# Patient Record
Sex: Female | Born: 1985 | Race: Black or African American | Hispanic: No | Marital: Single | Smoking: Never smoker
Health system: Southern US, Community
[De-identification: ages and names within clinical notes are randomized; demographics above are authoritative.]

## PROBLEM LIST (undated history)

## (undated) ENCOUNTER — Ambulatory Visit: Admission: EM | Source: Home / Self Care

## (undated) DIAGNOSIS — IMO0002 Reserved for concepts with insufficient information to code with codable children: Secondary | ICD-10-CM

## (undated) DIAGNOSIS — O1490 Unspecified pre-eclampsia, unspecified trimester: Secondary | ICD-10-CM

## (undated) HISTORY — DX: Unspecified pre-eclampsia, unspecified trimester: O14.90

## (undated) HISTORY — DX: Reserved for concepts with insufficient information to code with codable children: IMO0002

---
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
## 2003-01-10 ENCOUNTER — Inpatient Hospital Stay (HOSPITAL_COMMUNITY): Admission: AD | Admit: 2003-01-10 | Discharge: 2003-01-11 | Payer: Self-pay | Admitting: Obstetrics

## 2003-02-28 ENCOUNTER — Inpatient Hospital Stay (HOSPITAL_COMMUNITY): Admission: AD | Admit: 2003-02-28 | Discharge: 2003-03-01 | Payer: Self-pay | Admitting: Obstetrics

## 2003-04-16 ENCOUNTER — Inpatient Hospital Stay (HOSPITAL_COMMUNITY): Admission: AD | Admit: 2003-04-16 | Discharge: 2003-04-16 | Payer: Self-pay | Admitting: Obstetrics

## 2003-04-19 ENCOUNTER — Inpatient Hospital Stay (HOSPITAL_COMMUNITY): Admission: AD | Admit: 2003-04-19 | Discharge: 2003-04-19 | Payer: Self-pay | Admitting: Obstetrics

## 2003-04-28 ENCOUNTER — Emergency Department (HOSPITAL_COMMUNITY): Admission: EM | Admit: 2003-04-28 | Discharge: 2003-04-28 | Payer: Self-pay | Admitting: Emergency Medicine

## 2003-05-03 ENCOUNTER — Inpatient Hospital Stay (HOSPITAL_COMMUNITY): Admission: AD | Admit: 2003-05-03 | Discharge: 2003-05-10 | Payer: Self-pay | Admitting: Obstetrics

## 2003-05-06 ENCOUNTER — Encounter (INDEPENDENT_AMBULATORY_CARE_PROVIDER_SITE_OTHER): Payer: Self-pay | Admitting: *Deleted

## 2003-07-30 ENCOUNTER — Emergency Department (HOSPITAL_COMMUNITY): Admission: EM | Admit: 2003-07-30 | Discharge: 2003-07-30 | Payer: Self-pay | Admitting: *Deleted

## 2004-06-09 ENCOUNTER — Emergency Department (HOSPITAL_COMMUNITY): Admission: EM | Admit: 2004-06-09 | Discharge: 2004-06-09 | Payer: Self-pay | Admitting: Emergency Medicine

## 2004-07-11 ENCOUNTER — Emergency Department (HOSPITAL_COMMUNITY): Admission: EM | Admit: 2004-07-11 | Discharge: 2004-07-11 | Payer: Self-pay | Admitting: Emergency Medicine

## 2004-08-18 ENCOUNTER — Inpatient Hospital Stay (HOSPITAL_COMMUNITY): Admission: AD | Admit: 2004-08-18 | Discharge: 2004-08-18 | Payer: Self-pay | Admitting: Obstetrics

## 2005-10-28 ENCOUNTER — Inpatient Hospital Stay (HOSPITAL_COMMUNITY): Admission: AD | Admit: 2005-10-28 | Discharge: 2005-10-28 | Payer: Self-pay | Admitting: Obstetrics

## 2005-11-20 ENCOUNTER — Emergency Department (HOSPITAL_COMMUNITY): Admission: EM | Admit: 2005-11-20 | Discharge: 2005-11-20 | Payer: Self-pay | Admitting: Emergency Medicine

## 2006-04-14 ENCOUNTER — Emergency Department (HOSPITAL_COMMUNITY): Admission: EM | Admit: 2006-04-14 | Discharge: 2006-04-14 | Payer: Self-pay | Admitting: Emergency Medicine

## 2006-05-30 ENCOUNTER — Ambulatory Visit (HOSPITAL_BASED_OUTPATIENT_CLINIC_OR_DEPARTMENT_OTHER): Admission: RE | Admit: 2006-05-30 | Discharge: 2006-05-30 | Payer: Self-pay | Admitting: Cardiology

## 2006-06-02 ENCOUNTER — Ambulatory Visit: Payer: Self-pay | Admitting: Internal Medicine

## 2006-06-20 ENCOUNTER — Inpatient Hospital Stay (HOSPITAL_COMMUNITY): Admission: AD | Admit: 2006-06-20 | Discharge: 2006-06-21 | Payer: Self-pay | Admitting: Obstetrics & Gynecology

## 2006-06-23 ENCOUNTER — Inpatient Hospital Stay (HOSPITAL_COMMUNITY): Admission: AD | Admit: 2006-06-23 | Discharge: 2006-06-23 | Payer: Self-pay | Admitting: Gynecology

## 2006-07-08 ENCOUNTER — Inpatient Hospital Stay (HOSPITAL_COMMUNITY): Admission: AD | Admit: 2006-07-08 | Discharge: 2006-07-08 | Payer: Self-pay | Admitting: Obstetrics & Gynecology

## 2006-07-24 ENCOUNTER — Inpatient Hospital Stay (HOSPITAL_COMMUNITY): Admission: AD | Admit: 2006-07-24 | Discharge: 2006-07-24 | Payer: Self-pay | Admitting: Obstetrics & Gynecology

## 2006-07-30 ENCOUNTER — Inpatient Hospital Stay (HOSPITAL_COMMUNITY): Admission: AD | Admit: 2006-07-30 | Discharge: 2006-07-31 | Payer: Self-pay | Admitting: Obstetrics

## 2007-01-31 ENCOUNTER — Encounter: Admission: RE | Admit: 2007-01-31 | Discharge: 2007-05-01 | Payer: Self-pay | Admitting: Obstetrics & Gynecology

## 2007-02-08 ENCOUNTER — Emergency Department (HOSPITAL_COMMUNITY): Admission: EM | Admit: 2007-02-08 | Discharge: 2007-02-08 | Payer: Self-pay | Admitting: Emergency Medicine

## 2007-06-04 ENCOUNTER — Inpatient Hospital Stay (HOSPITAL_COMMUNITY): Admission: AD | Admit: 2007-06-04 | Discharge: 2007-06-04 | Payer: Self-pay | Admitting: Obstetrics & Gynecology

## 2007-08-13 ENCOUNTER — Encounter: Admission: RE | Admit: 2007-08-13 | Discharge: 2007-08-13 | Payer: Self-pay | Admitting: Family Medicine

## 2007-09-25 ENCOUNTER — Emergency Department (HOSPITAL_COMMUNITY): Admission: EM | Admit: 2007-09-25 | Discharge: 2007-09-25 | Payer: Self-pay | Admitting: Family Medicine

## 2008-01-06 ENCOUNTER — Emergency Department (HOSPITAL_COMMUNITY): Admission: EM | Admit: 2008-01-06 | Discharge: 2008-01-06 | Payer: Self-pay | Admitting: Emergency Medicine

## 2008-04-09 ENCOUNTER — Ambulatory Visit: Payer: Self-pay | Admitting: Nurse Practitioner

## 2008-04-09 DIAGNOSIS — K648 Other hemorrhoids: Secondary | ICD-10-CM | POA: Insufficient documentation

## 2008-04-09 DIAGNOSIS — Z8711 Personal history of peptic ulcer disease: Secondary | ICD-10-CM | POA: Insufficient documentation

## 2008-04-15 ENCOUNTER — Ambulatory Visit: Payer: Self-pay | Admitting: *Deleted

## 2008-04-28 ENCOUNTER — Ambulatory Visit: Payer: Self-pay | Admitting: Nurse Practitioner

## 2008-04-28 ENCOUNTER — Encounter (INDEPENDENT_AMBULATORY_CARE_PROVIDER_SITE_OTHER): Payer: Self-pay | Admitting: Nurse Practitioner

## 2008-04-28 LAB — CONVERTED CEMR LAB
Blood in Urine, dipstick: NEGATIVE
Glucose, Urine, Semiquant: NEGATIVE
Ketones, urine, test strip: NEGATIVE
Nitrite: NEGATIVE
Protein, U semiquant: NEGATIVE

## 2008-04-29 ENCOUNTER — Encounter (INDEPENDENT_AMBULATORY_CARE_PROVIDER_SITE_OTHER): Payer: Self-pay | Admitting: Nurse Practitioner

## 2008-04-29 LAB — CONVERTED CEMR LAB
ALT: 11 units/L (ref 0–35)
Albumin: 3.8 g/dL (ref 3.5–5.2)
BUN: 12 mg/dL (ref 6–23)
Basophils Absolute: 0 10*3/uL (ref 0.0–0.1)
CO2: 24 meq/L (ref 19–32)
Calcium: 9 mg/dL (ref 8.4–10.5)
Chlamydia, DNA Probe: NEGATIVE
Chloride: 103 meq/L (ref 96–112)
Cholesterol: 168 mg/dL (ref 0–200)
Creatinine, Ser: 0.74 mg/dL (ref 0.40–1.20)
GC Probe Amp, Genital: NEGATIVE
Hemoglobin: 10.7 g/dL — ABNORMAL LOW (ref 12.0–15.0)
Lymphocytes Relative: 21 % (ref 12–46)
Monocytes Absolute: 0.6 10*3/uL (ref 0.1–1.0)
Monocytes Relative: 5 % (ref 3–12)
Neutro Abs: 9.3 10*3/uL — ABNORMAL HIGH (ref 1.7–7.7)
Potassium: 4.2 meq/L (ref 3.5–5.3)
RBC: 5.39 M/uL — ABNORMAL HIGH (ref 3.87–5.11)
Total CHOL/HDL Ratio: 4

## 2008-05-06 DIAGNOSIS — B977 Papillomavirus as the cause of diseases classified elsewhere: Secondary | ICD-10-CM | POA: Insufficient documentation

## 2008-05-06 DIAGNOSIS — R87619 Unspecified abnormal cytological findings in specimens from cervix uteri: Secondary | ICD-10-CM | POA: Insufficient documentation

## 2008-05-10 ENCOUNTER — Telehealth (INDEPENDENT_AMBULATORY_CARE_PROVIDER_SITE_OTHER): Payer: Self-pay | Admitting: Nurse Practitioner

## 2008-05-14 ENCOUNTER — Ambulatory Visit: Payer: Self-pay | Admitting: Nurse Practitioner

## 2008-05-14 DIAGNOSIS — N898 Other specified noninflammatory disorders of vagina: Secondary | ICD-10-CM | POA: Insufficient documentation

## 2008-05-14 LAB — CONVERTED CEMR LAB
Chlamydia, Swab/Urine, PCR: NEGATIVE
Glucose, Urine, Semiquant: NEGATIVE
KOH Prep: NEGATIVE
Ketones, urine, test strip: NEGATIVE
Specific Gravity, Urine: >=1.03
Urobilinogen, UA: 0.2
pH: 5.5

## 2008-05-17 ENCOUNTER — Encounter (INDEPENDENT_AMBULATORY_CARE_PROVIDER_SITE_OTHER): Payer: Self-pay | Admitting: Nurse Practitioner

## 2008-05-18 ENCOUNTER — Encounter (INDEPENDENT_AMBULATORY_CARE_PROVIDER_SITE_OTHER): Payer: Self-pay | Admitting: Nurse Practitioner

## 2008-06-02 ENCOUNTER — Ambulatory Visit: Payer: Self-pay | Admitting: Obstetrics and Gynecology

## 2008-06-02 ENCOUNTER — Other Ambulatory Visit: Admission: RE | Admit: 2008-06-02 | Discharge: 2008-06-02 | Payer: Self-pay | Admitting: Obstetrics and Gynecology

## 2008-06-16 ENCOUNTER — Ambulatory Visit: Payer: Self-pay | Admitting: Obstetrics & Gynecology

## 2008-06-22 ENCOUNTER — Encounter (INDEPENDENT_AMBULATORY_CARE_PROVIDER_SITE_OTHER): Payer: Self-pay | Admitting: Nurse Practitioner

## 2008-07-14 ENCOUNTER — Telehealth (INDEPENDENT_AMBULATORY_CARE_PROVIDER_SITE_OTHER): Payer: Self-pay | Admitting: Nurse Practitioner

## 2008-07-14 DIAGNOSIS — N926 Irregular menstruation, unspecified: Secondary | ICD-10-CM | POA: Insufficient documentation

## 2008-07-14 DIAGNOSIS — N939 Abnormal uterine and vaginal bleeding, unspecified: Secondary | ICD-10-CM

## 2008-09-01 ENCOUNTER — Emergency Department (HOSPITAL_COMMUNITY): Admission: EM | Admit: 2008-09-01 | Discharge: 2008-09-01 | Payer: Self-pay | Admitting: Emergency Medicine

## 2008-09-06 ENCOUNTER — Ambulatory Visit: Payer: Self-pay | Admitting: Family Medicine

## 2008-09-06 DIAGNOSIS — G44209 Tension-type headache, unspecified, not intractable: Secondary | ICD-10-CM | POA: Insufficient documentation

## 2008-10-11 ENCOUNTER — Telehealth (INDEPENDENT_AMBULATORY_CARE_PROVIDER_SITE_OTHER): Payer: Self-pay | Admitting: Nurse Practitioner

## 2008-12-07 ENCOUNTER — Inpatient Hospital Stay (HOSPITAL_COMMUNITY): Admission: AD | Admit: 2008-12-07 | Discharge: 2008-12-07 | Payer: Self-pay | Admitting: Obstetrics and Gynecology

## 2009-06-30 ENCOUNTER — Ambulatory Visit: Payer: Self-pay | Admitting: Nurse Practitioner

## 2009-06-30 DIAGNOSIS — B354 Tinea corporis: Secondary | ICD-10-CM | POA: Insufficient documentation

## 2009-06-30 LAB — CONVERTED CEMR LAB
Nitrite: NEGATIVE
Protein, U semiquant: NEGATIVE
Urobilinogen, UA: 0.2
WBC Urine, dipstick: NEGATIVE

## 2009-07-04 ENCOUNTER — Encounter (INDEPENDENT_AMBULATORY_CARE_PROVIDER_SITE_OTHER): Payer: Self-pay | Admitting: Nurse Practitioner

## 2009-07-04 DIAGNOSIS — D649 Anemia, unspecified: Secondary | ICD-10-CM

## 2009-07-04 LAB — CONVERTED CEMR LAB
ALT: 11 units/L (ref 0–35)
AST: 15 units/L (ref 0–37)
BUN: 13 mg/dL (ref 6–23)
Basophils Relative: 0 % (ref 0–1)
CO2: 24 meq/L (ref 19–32)
Calcium: 8.8 mg/dL (ref 8.4–10.5)
Chloride: 104 meq/L (ref 96–112)
Cholesterol: 164 mg/dL (ref 0–200)
Creatinine, Ser: 0.61 mg/dL (ref 0.40–1.20)
Eosinophils Absolute: 0.2 10*3/uL (ref 0.0–0.7)
Eosinophils Relative: 2 % (ref 0–5)
Ferritin: 18 ng/mL (ref 10–291)
Folate: 16.3 ng/mL
HCT: 33.9 % — ABNORMAL LOW (ref 36.0–46.0)
HDL: 37 mg/dL — ABNORMAL LOW (ref >39)
MCHC: 28 g/dL — ABNORMAL LOW (ref 30.0–36.0)
MCV: 64.7 fL — ABNORMAL LOW (ref 78.0–100.0)
Neutrophils Relative %: 70 % (ref 43–77)
Platelets: 469 10*3/uL — ABNORMAL HIGH (ref 150–400)
Saturation Ratios: 6 % — ABNORMAL LOW (ref 20–55)
TIBC: 331 ug/dL (ref 250–470)
Total Bilirubin: 0.4 mg/dL (ref 0.3–1.2)
Total CHOL/HDL Ratio: 4.4
VLDL: 14 mg/dL (ref 0–40)

## 2009-11-30 ENCOUNTER — Ambulatory Visit: Payer: Self-pay | Admitting: Nurse Practitioner

## 2009-11-30 DIAGNOSIS — R252 Cramp and spasm: Secondary | ICD-10-CM | POA: Insufficient documentation

## 2009-11-30 LAB — CONVERTED CEMR LAB
Blood in Urine, dipstick: NEGATIVE
Cholesterol, target level: 200 mg/dL
KOH Prep: NEGATIVE
Nitrite: NEGATIVE
Protein, U semiquant: NEGATIVE
Urobilinogen, UA: 0.2
WBC Urine, dipstick: NEGATIVE
pH: 6

## 2009-12-02 ENCOUNTER — Encounter (INDEPENDENT_AMBULATORY_CARE_PROVIDER_SITE_OTHER): Payer: Self-pay | Admitting: Nurse Practitioner

## 2009-12-02 LAB — CONVERTED CEMR LAB
Eosinophils Absolute: 0.2 10*3/uL (ref 0.0–0.7)
HCT: 33.4 % — ABNORMAL LOW (ref 36.0–46.0)
Hemoglobin: 9.5 g/dL — ABNORMAL LOW (ref 12.0–15.0)
Lymphs Abs: 2.7 10*3/uL (ref 0.7–4.0)
MCHC: 28.4 g/dL — ABNORMAL LOW (ref 30.0–36.0)
MCV: 66.8 fL — ABNORMAL LOW (ref 78.0–100.0)
Monocytes Absolute: 0.7 10*3/uL (ref 0.1–1.0)
Monocytes Relative: 6 % (ref 3–12)
Neutrophils Relative %: 69 % (ref 43–77)
Pap Smear: NEGATIVE
RBC: 5 M/uL (ref 3.87–5.11)
WBC: 11.6 10*3/uL — ABNORMAL HIGH (ref 4.0–10.5)

## 2009-12-06 ENCOUNTER — Telehealth (INDEPENDENT_AMBULATORY_CARE_PROVIDER_SITE_OTHER): Payer: Self-pay | Admitting: Nurse Practitioner

## 2010-01-05 ENCOUNTER — Encounter (INDEPENDENT_AMBULATORY_CARE_PROVIDER_SITE_OTHER): Payer: Self-pay | Admitting: Nurse Practitioner

## 2010-01-05 ENCOUNTER — Ambulatory Visit: Payer: Self-pay | Admitting: Internal Medicine

## 2010-01-05 DIAGNOSIS — IMO0002 Reserved for concepts with insufficient information to code with codable children: Secondary | ICD-10-CM | POA: Insufficient documentation

## 2010-02-07 IMAGING — US US TRANSVAGINAL NON-OB
1 series · 14 of 25 positions shown · non-contrast
Comparison: none

CLINICAL DATA: Left-sided pelvic pain.  LMP:  05/19/06.
 TRANSABDOMINAL AND TRANSVAGINAL PELVIC ULTRASOUND:
TECHNIQUE: Both transabdominal and transvaginal ultrasound examinations of the pelvis were performed including evaluation of the uterus, ovaries, adnexal regions, and pelvic cul-de-sac.

[Series 1: us transvaginal non-ob · 0.28mm/px · 14 of 62 slices shown]
[im 1/62]
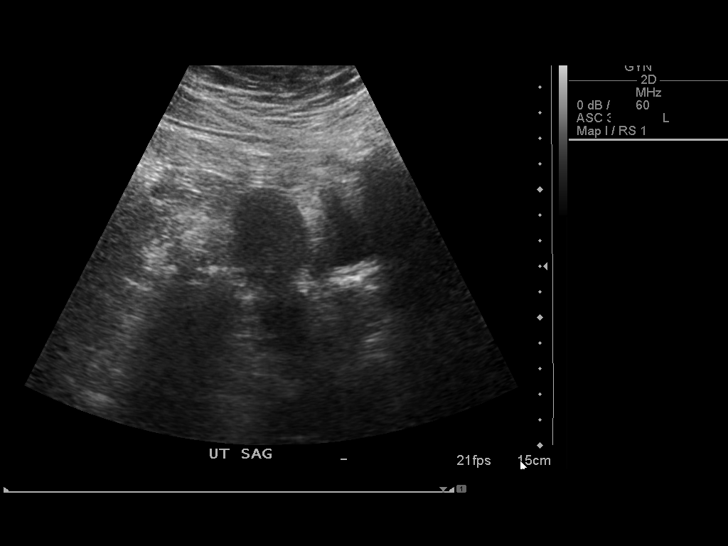
[im 6/62]
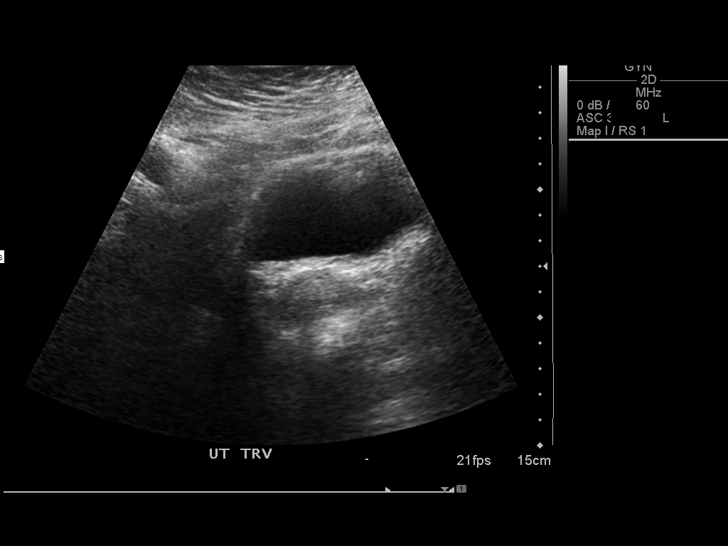
[im 11/62]
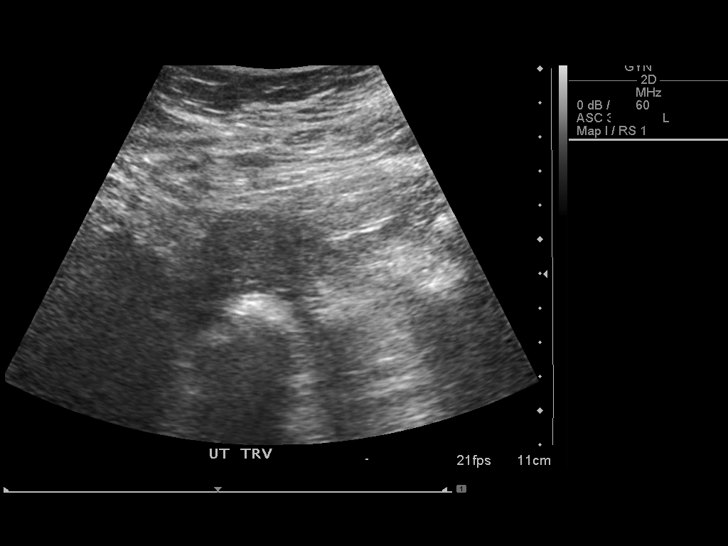
[im 16/62]
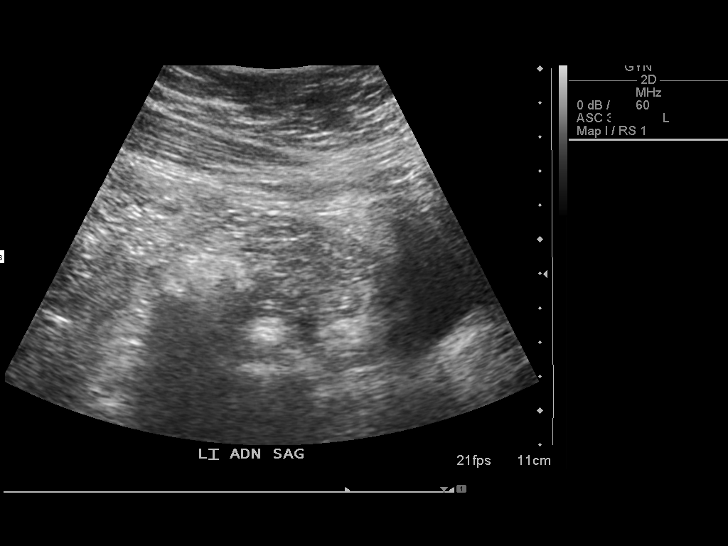
[im 21/62]
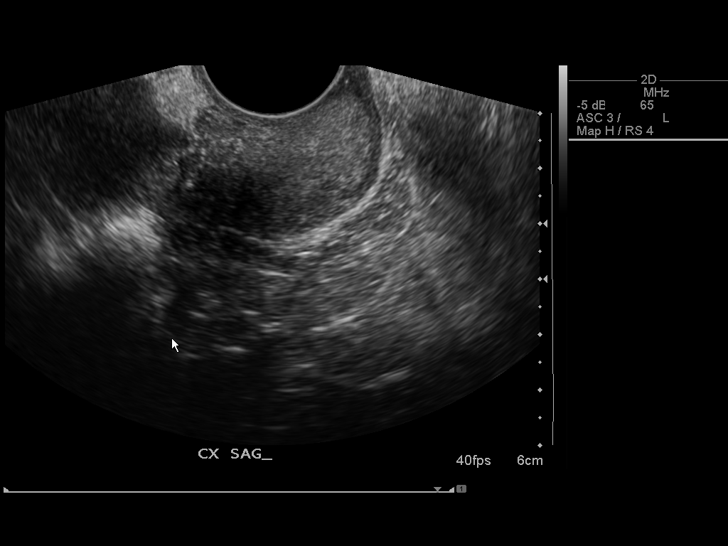
[im 23/62]
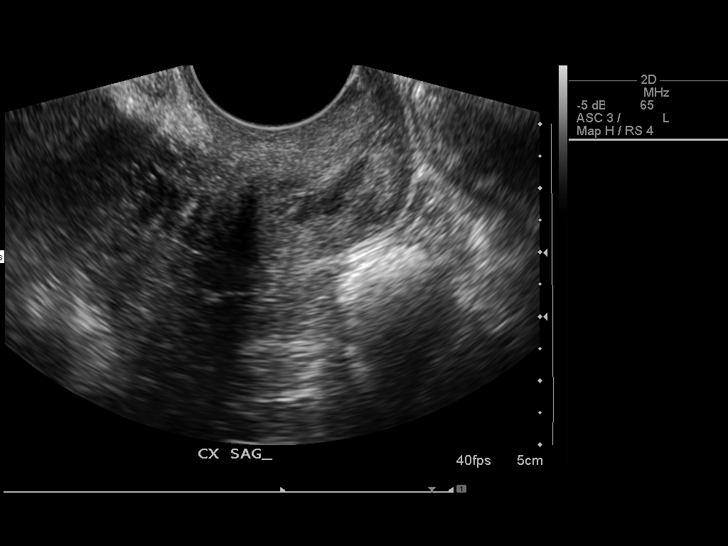
[im 28/62]
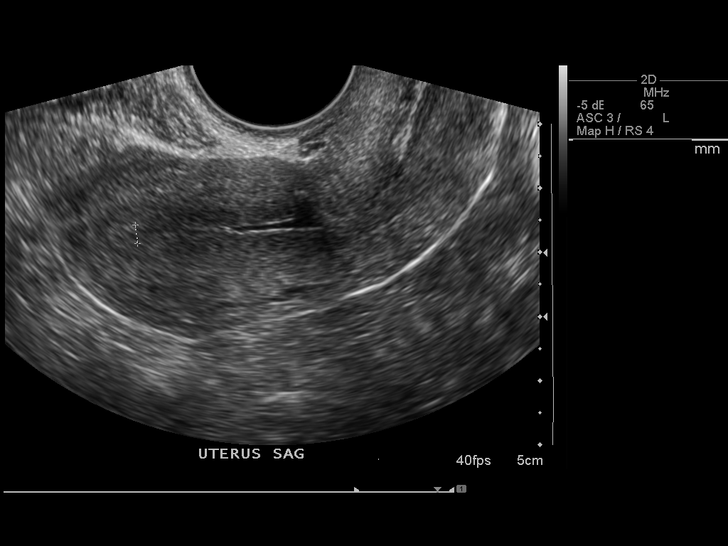
[im 34/62]
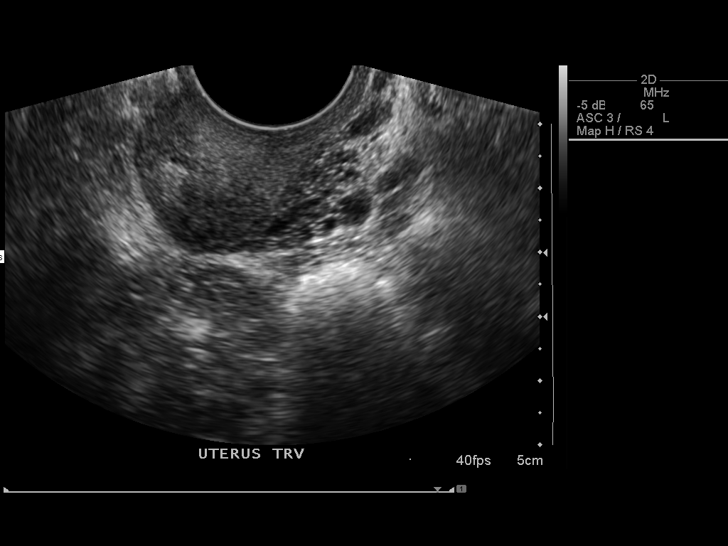
[im 39/62]
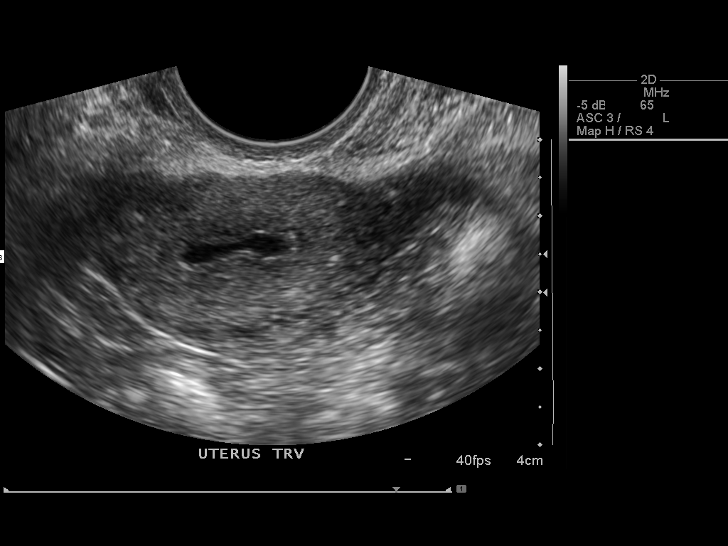
[im 41/62]
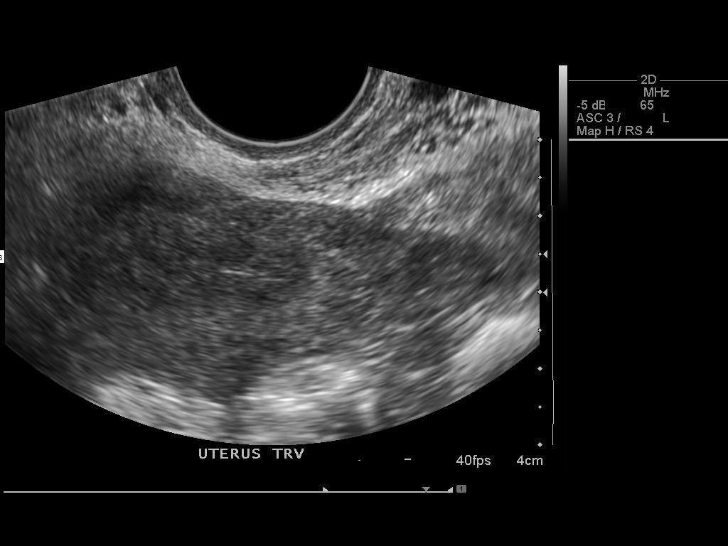
[im 46/62]
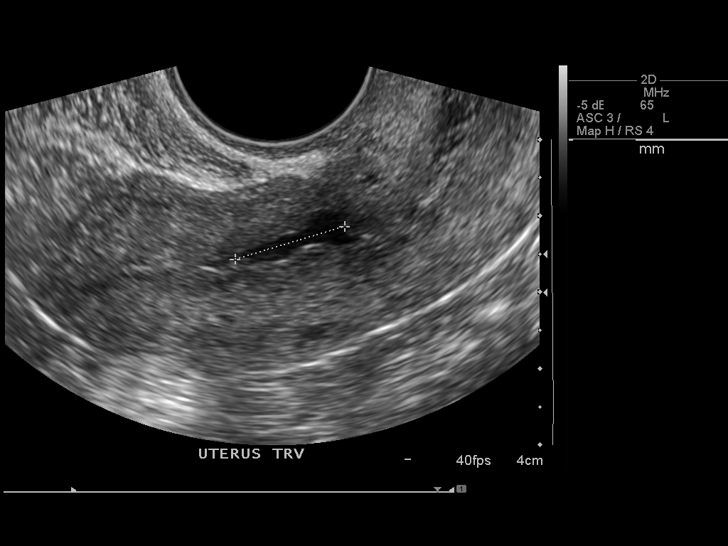
[im 51/62]
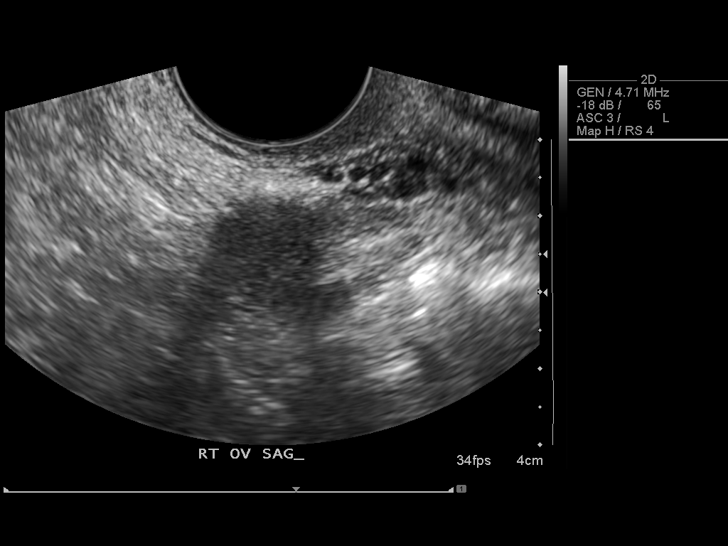
[im 56/62]
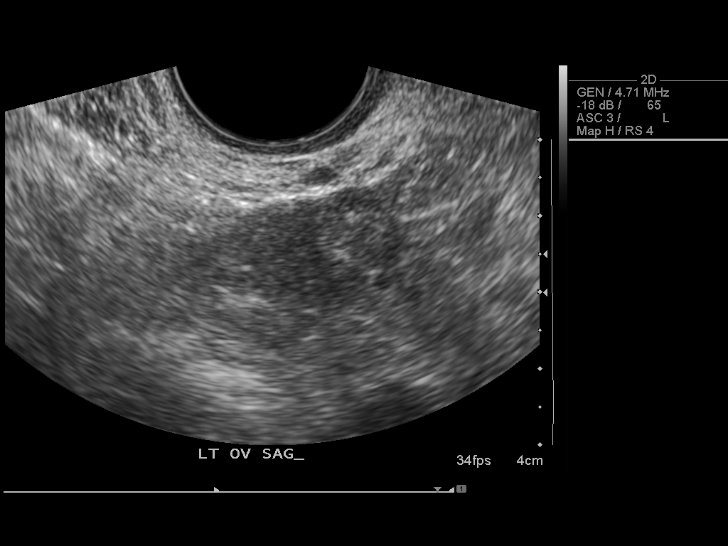
[im 62/62]
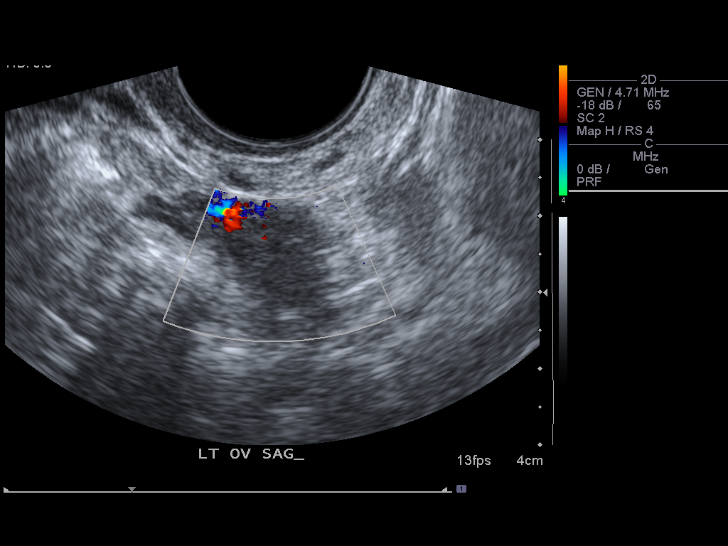

[14 of 25 positions shown; findings below may reference images not displayed]

FINDINGS: The uterus is normal in size measuring approximately 8 cm in length.  No fibroids or other uterine masses are identified.  There is a tiny amount of fluid noted in the endometrial cavity and endometrial thickness measures 1-2 mm.  
 Both ovaries are normal in appearance.  No adnexal masses are identified by transabdominal or transvaginal sonography.
IMPRESSION: 1.  Normal uterus.  No evidence of fibroids.
 2.  Normal ovaries.  No evidence of adnexal mass.

## 2010-02-24 ENCOUNTER — Emergency Department (HOSPITAL_COMMUNITY)
Admission: EM | Admit: 2010-02-24 | Discharge: 2010-02-24 | Payer: Self-pay | Source: Home / Self Care | Admitting: Family Medicine

## 2010-03-01 ENCOUNTER — Ambulatory Visit: Payer: Self-pay | Admitting: Nurse Practitioner

## 2010-03-01 DIAGNOSIS — M79609 Pain in unspecified limb: Secondary | ICD-10-CM

## 2010-03-31 ENCOUNTER — Telehealth (INDEPENDENT_AMBULATORY_CARE_PROVIDER_SITE_OTHER): Payer: Self-pay | Admitting: Nurse Practitioner

## 2010-04-02 DIAGNOSIS — IMO0002 Reserved for concepts with insufficient information to code with codable children: Secondary | ICD-10-CM

## 2010-04-02 DIAGNOSIS — R87619 Unspecified abnormal cytological findings in specimens from cervix uteri: Secondary | ICD-10-CM

## 2010-04-02 HISTORY — DX: Unspecified abnormal cytological findings in specimens from cervix uteri: R87.619

## 2010-04-02 HISTORY — PX: COLPOSCOPY: SHX161

## 2010-04-02 HISTORY — DX: Reserved for concepts with insufficient information to code with codable children: IMO0002

## 2010-04-03 ENCOUNTER — Emergency Department (HOSPITAL_COMMUNITY)
Admission: EM | Admit: 2010-04-03 | Discharge: 2010-04-03 | Payer: Self-pay | Source: Home / Self Care | Admitting: Family Medicine

## 2010-04-05 ENCOUNTER — Telehealth (INDEPENDENT_AMBULATORY_CARE_PROVIDER_SITE_OTHER): Payer: Self-pay | Admitting: Nurse Practitioner

## 2010-04-20 ENCOUNTER — Ambulatory Visit: Admit: 2010-04-20 | Payer: Self-pay | Admitting: Nurse Practitioner

## 2010-04-26 ENCOUNTER — Ambulatory Visit: Admit: 2010-04-26 | Payer: Self-pay | Admitting: Nurse Practitioner

## 2010-05-02 NOTE — Assessment & Plan Note (Signed)
Summary: Family Planning   Vital Signs:  Patient profile:   25 year old female Menstrual status:  regular LMP:     02/04/2010 Weight:      278.1 pounds BMI:     46.45 Temp:     97.8 degrees F oral Pulse rate:   76 / minute Pulse rhythm:   regular Resp:     20 per minute BP sitting:   140 / 80  (left arm) Cuff size:   large  Vitals Entered By: Levon Hedger (March 01, 2010 2:37 PM)  Nutrition Counseling: Patient's BMI is greater than 25 and therefore counseled on weight management options. CC: right big toe still giving her pain...wanats to get on birth control Is Patient Diabetic? No Pain Assessment Patient in pain? yes     Location: toe Intensity: 7 Type: throbbing  Does patient need assistance? Functional Status Self care Ambulation Normal LMP (date): 02/04/2010 LMP - Character: normal    Menses interval (days): 28 Menstrual flow (days): 5 Menstrual Status regular Enter LMP: 02/04/2010 Last PAP Result  Specimen Adequacy: Satisfactory for evaluation.   Interpretation/Result:Negative for intraepithelial Lesion or Malignancy.      Primary Care Provider:  Lehman Prom FNP  CC:  right big toe still giving her pain...wanats to get on birth control.  History of Present Illness:  Pt into the office still with come and go. Pt has been to the ER and x-rays of the right toe done no fracture joint not swollen Wears sneakers to work and that is the shoe she wears the majority of the time  Family Planning - pt was previously on implanon and it was removed 12/2008. Pt is now in a relationship and wants to restart meds. LMP 11.5.11 Pt was previously on birth control pills and she tolerated well but then she got pregnant in 2008 and had inplanon placed.  She does not want another implanon. Pt reports that she will be able to remember to take the meds.  Presents today with her maie signficant other  Obesithy - still working on weight. 1 pounds gain since last  visit but she admits she "cheated" during the holiday weekend  Allergies (verified): 1)  ! Codeine  Review of Systems General:  Denies fever. CV:  Denies chest pain or discomfort. Resp:  Denies cough. GI:  Denies abdominal pain, nausea, and vomiting. GU:  Denies discharge, hematuria, and urinary frequency. MS:  right great toe - NO inflammation tenderness with palpation of lateral paroncyia.  Physical Exam  General:  alert.   Head:  normocephalic.   Neurologic:  alert & oriented X3.     Foot/Ankle Exam  Foot Exam:    Right:    Inspection:  Normal    Palpation:  Normal    Stability:  stable    Tenderness:  no    Swelling:  no    Erythema:  no    no ingrown toenail   Impression & Recommendations:  Problem # 1:  TOE PAIN (ICD-729.5) advised pt that symptoms may be coming from the type of shoe that she wears need cushion in between to prevent toes from crowded  Problem # 2:  FAMILY PLANNING (ICD-V25.09) will start birth control pills advised pt to start the sunday after her next period condoms for the first month Orders: Urine Pregnancy Test  ([address]109)  Problem # 3:  MORBID OBESITY (ICD-278.01) continue to monitor wieght  Complete Medication List: 1)  Nystatin-triamcinolone 100000-0.1 Unit/gm-% Crea (Nystatin-triamcinolone) .... One application  topically to affected area until healed 2)  Ferrous Sulfate 325 (65 Fe) Mg Tabs (Ferrous sulfate) .... One tablet by mouth two times a day 3)  Sprintec 28 0.25-35 Mg-mcg Tabs (Norgestimate-eth estradiol) .... One tablet by mouth daily for birth control  Patient Instructions: 1)  Toe pain - x-rays normal as done in the hospital 2)  No gout as joint is not affected 3)  No ingrown toenails 4)  Monitor you shoes. Pain may be the way your toes are positioned in the shoes 5)  Family planning - start birth control pills.  Start on the sunday after your next period.  You may have some spotting once you start the pills since you  have not taken hormones in some time but this will get better. Be sure you use condoms for the first month until you are transitioned to the pills to be sure they are in your system and are effective Prescriptions: SPRINTEC 28 0.25-35 MG-MCG TABS (NORGESTIMATE-ETH ESTRADIOL) One tablet by mouth daily for birth control  #1 pack x 5   Entered and Authorized by:   Lehman Prom FNP   Signed by:   Lehman Prom FNP on 03/01/2010   Method used:   Print then Give to Patient   RxID:   [id number]    Orders Added: 1)  Est. Patient Level III [31517] 2)  Urine Pregnancy Test  [81025]    Prevention & Chronic Care Immunizations   Influenza vaccine: given at work  (01/05/2010)    Tetanus booster: 04/03/2003: historical per pt    Pneumococcal vaccine: Not documented  Other Screening   Pap smear:  Specimen Adequacy: Satisfactory for evaluation.   Interpretation/Result:Negative for intraepithelial Lesion or Malignancy.     (11/30/2009)   Pap smear action/deferral: PAP smear done  (04/28/2008)   Pap smear due: 12/2010   Smoking status: never  (11/30/2009)

## 2010-05-02 NOTE — Letter (Signed)
Summary: *HSN Results Follow up  [facility]  [address] [place], [place] [address]   Phone: [phone]  Fax: [phone]      12/02/2009   AZARIA STEGMAN [address] [place], [place]  [address]   Dear  Ms. Glenna Fellows,                            ____S.Drinkard,FNP   ____D. Gore,FNP       ____B. McPherson,MD   ____V. Rankins,MD    ____E. Mulberry,MD    _X___N. Daphine Deutscher, FNP  ____D. Reche Dixon, MD    ____K. Philipp Deputy, MD    ____Other     This letter is to inform you that your recent test(s):  ___X____Pap Smear    _______Lab Test     _______X-ray    ___X____ is within acceptable limits  _______ requires a medication change  _______ requires a follow-up lab visit  _______ requires a follow-up visit with your provider   Comments:  Pap Smear results are normal.       _________________________________________________________ If you have any questions, please contact our office [phone].                    Sincerely,    Lehman Prom FNP [facility]

## 2010-05-02 NOTE — Progress Notes (Signed)
Summary: Office Visit/DEPRESSION SCREENING  Office Visit/DEPRESSION SCREENING   Imported By: Arta Bruce 11/30/2009 14:25:33  _____________________________________________________________________  External Attachment:    Type:   Image     Comment:   External Document

## 2010-05-02 NOTE — Progress Notes (Signed)
Summary: LAB RESULTS  Phone Note Call from Patient Call back at [phone]   Caller: Patient Reason for Call: Lab or Test Results Summary of Call: PT WOULD LIKE TO GET LAB RESULTS Initial call taken by: Oscar La,  December 06, 2009 2:54 PM  Follow-up for Phone Call        Michaela Carrillo  December 06, 2009 5:09 PM Left message on machine for pt to return call to the office.  Additional Follow-up for Phone Call Additional follow up Details #1::        Michaela Carrillo RETURNED PHONE CALL FROM YESTERDAY. SHE SAYS TO LET [facility] KNOW THAT SHE'S NOT FEELING SICK BUT SHE IS HAVING PAIN IN HER STOMACH AND BACK. Michaela Carrillo ALSO SAYS THAT IF SHE DOESN'T ANSWER WHEN YOU CALL BACK, THAT IT IS OK TO LEAVE THE RESULTS ON HER PHONE  Additional Follow-up by: Michaela Carrillo,  December 07, 2009 10:01 AM    Additional Follow-up for Phone Call Additional follow up Details #2::    spoke with pt and informed her again of lab results which we had already talked about and to give her pap results. Follow-up by: Michaela Carrillo,  December 08, 2009 10:20 AM

## 2010-05-02 NOTE — Letter (Signed)
Summary: Handout Printed  Printed Handout:  - Shin Splints

## 2010-05-02 NOTE — Assessment & Plan Note (Signed)
Summary: Complete Physical Exam   Vital Signs:  Patient profile:   25 year old female Height:      65 inches Weight:      281.8 pounds BMI:     47.06 Temp:     98.2 degrees F oral Pulse rate:   60 / minute Pulse rhythm:   regular Resp:     16 per minute BP sitting:   108 / 70  (left arm) Cuff size:   large  Vitals Entered By: Levon Hedger (November 30, 2009 11:10 AM)  Nutrition Counseling: Patient's BMI is greater than 25 and therefore counseled on weight management options. CC: CPP, Lipid Management Is Patient Diabetic? No Pain Assessment Patient in pain? no       Does patient need assistance? Functional Status Self care Ambulation Normal   Primary Care Provider:  Lehman Prom FNP  CC:  CPP and Lipid Management.  History of Present Illness:  Pt into the office for complete physical exam  PAP - Last PAP done 04/2008 in this office No history of abnormal PAP Menses - monthly - ? how often menses starts which is between 22-24 days 1 daughter Not currently in a relationship  Mammogram - never had a mammogram no problems with breast no self breast exams at home  Obesity - pt has lost 16 pounds since her last visit.  Pt has been trying to change her diet.  She has not incorported exercise in her routine.    tdap - up to date  Optho - No current glasses. Last exam was over 2 years ago.  Dental - no recent dental exam  Social - pt is employed at [employer] and works 7-3pm She recently purchased a house  Lipid Management History:      Positive NCEP/ATP III risk factors include HDL cholesterol less than 40.  Negative NCEP/ATP III risk factors include female age less than 14 years old, no history of early menopause without estrogen hormone replacement, non-diabetic, no family history for ischemic heart disease, non-tobacco-user status, non-hypertensive, no ASHD (atherosclerotic heart disease), no prior stroke/TIA, no peripheral vascular disease, and no history of  aortic aneurysm.    Habits & Providers  Alcohol-Tobacco-Diet     Alcohol drinks/day: 0     Tobacco Status: never  Exercise-Depression-Behavior     Does Patient Exercise: no     Drug Use: never  Comments: PHQ-9 score = 0  Allergies (verified): 1)  ! Codeine  Social History: Does Patient Exercise:  no Drug Use:  never  Diabetic Foot Exam Foot Inspection Is there a history of a foot ulcer?              No Is there a foot ulcer now?              No Can the patient see the bottom of their feet?          No Are the shoes appropriate in style and fit?          Yes Is there swelling or an abnormal foot shape?          No Are the toenails long?                Yes Are the toenails thick?                Yes Are the toenails ingrown?              No Is there heavy callous build-up?  No Is there pain in the calf muscle (Intermittent claudication) when walking?    NoIs there a claw toe deformity?              No Is there elevated skin temperature?            No Is there limited ankle dorsiflexion?            No Is there foot or ankle muscle weakness?            No  Diabetic Foot Care Education Pulse Check          Right Foot          Left Foot Dorsalis Pedis:        normal            normal   Review of Systems General:  Denies fever. Eyes:  Denies blurring and discharge. ENT:  Denies earache. CV:  Denies chest pain or discomfort. Resp:  Denies cough. GU:  Denies discharge. MS:  Denies joint pain. Neuro:  Complains of tingling; right great toe - for the past 2 weeks she has awoke and noticed tingling in the great toe.  as she gets up and moves around the tingling gets better. Psych:  Denies depression.  Physical Exam  General:  alert.  obese Head:  normocephalic.   Eyes:  pupils equal, pupils round, and pupils reactive to light.   Ears:  bil TM visible Nose:  no nasal discharge.   Mouth:  fair dentition.   Neck:  supple.   Chest Wall:  no deformities.     Breasts:  skin/areolae normal.   Lungs:  normal breath sounds.   Heart:  normal rate and regular rhythm.   Abdomen:  soft, non-tender, and normal bowel sounds.   Rectal:  no external abnormalities.   Msk:  up to the exam table Pulses:  R dorsalis pedis normal and L dorsalis pedis normal.   Extremities:  no edema Neurologic:  gait normal.   Skin:  color normal.   Psych:  Oriented X3.    Pelvic Exam  Vulva:      normal appearance.   Urethra and Bladder:      Urethra--normal.  Bladder--normal.   Vagina:      malodorus.   Cervix:      midposition.   Uterus:      smooth.   Adnexa:      nontender bilaterally.   Rectum:      normal.      Impression & Recommendations:  Problem # 1:  ROUTINE GYNECOLOGICAL EXAMINATION (ICD-V72.31) PHQ-9 score = 0 PAP done self breast exam placcard given labs reviewed from previous visit Orders: KOH/ WET Mount [phone]) Pap Smear, Thin Prep ( Collection of) [phone]) T- GC Chlamydia (14782)  Problem # 2:  MORBID OBESITY (ICD-278.01) advised pt to incorportate exercise in her routine  Problem # 3:  LEG CRAMPS (ICD-729.82) will check potassium but advised pt that cramps may be due to anemia Orders: T-Potassium  ([id number])  Problem # 4:  ANEMIA (ICD-285.9) pt is not taking her iron  advised her to restart will check labs today Her updated medication list for this problem includes:    Ferrous Sulfate 325 (65 Fe) Mg Tabs (Ferrous sulfate) ..... One tablet by mouth two times a day  Orders: T-CBC w/Diff ([id number])  Complete Medication List: 1)  Nystatin-triamcinolone 100000-0.1 Unit/gm-% Crea (Nystatin-triamcinolone) .... One application topically to affected area until healed 2)  Ferrous Sulfate  325 (65 Fe) Mg Tabs (Ferrous sulfate) .... One tablet by mouth two times a day  Lipid Assessment/Plan:      Based on NCEP/ATP III, the patient's risk factor category is "0-1 risk factors".  The patient's lipid goals are as follows: Total  cholesterol goal is 200; LDL cholesterol goal is 160; HDL cholesterol goal is 40; Triglyceride goal is 150.  Her LDL cholesterol goal has not been met.     Patient Instructions: 1)  CONGRATULATIONS on your weight loss. 2)  Continue to make healthy food choices.  Also start to incorporate exercise such as walking 3)  Anemia - labs will be checked today and you will be notified of the results.  You will likely need to restart your iron pills twice daily. 4)  Follow up in 1 year for complete physical exam or sooner if sick  Prevention & Chronic Care Immunizations   Influenza vaccine: Not documented    Tetanus booster: 04/03/2003: historical per pt    Pneumococcal vaccine: Not documented  Other Screening   Pap smear:  Specimen Adequacy: Satisfactory for evaluation.   Squamous Cell: Low Grade squamous intraepithelial lesion (LSIL).    (04/28/2008)   Pap smear action/deferral: PAP smear done  (04/28/2008)   Smoking status: never  (11/30/2009)  Laboratory Results   Urine Tests  Date/Time Received: November 30, 2009 1:04 PM   Routine Urinalysis   Color: lt. yellow Glucose: negative   (Normal Range: Negative) Bilirubin: negative   (Normal Range: Negative) Ketone: negative   (Normal Range: Negative) Spec. Gravity: 1.010   (Normal Range: 1.003-1.035) Blood: negative   (Normal Range: Negative) pH: 6.0   (Normal Range: 5.0-8.0) Protein: negative   (Normal Range: Negative) Urobilinogen: 0.2   (Normal Range: 0-1) Nitrite: negative   (Normal Range: Negative) Leukocyte Esterace: negative   (Normal Range: Negative)    Date/Time Received: November 30, 2009 1:04 PM   [employer] Source: vaginal WBC/hpf: 1-5 Bacteria/hpf: rare Clue cells/hpf: none Yeast/hpf: none Wet Mount KOH: Negative Trichomonas/hpf: none

## 2010-05-02 NOTE — Assessment & Plan Note (Signed)
Summary: Vaginal discharge/Rash   Vital Signs:  Patient profile:   25 year old female LMP:     06/08/2009 Weight:      295.0 pounds BSA:     2.34 Temp:     98.3 degrees F oral Pulse rate:   50 / minute Pulse rhythm:   regular Resp:     16 per minute BP sitting:   102 / 67  (left arm) Cuff size:   large  Vitals Entered By: Levon Hedger (June 30, 2009 10:37 AM) CC: vafinal discharge and skin rash on chest area, Vaginal discharge, Rash Is Patient Diabetic? No Pain Assessment Patient in pain? no       Does patient need assistance? Functional Status Self care Ambulation Normal LMP (date): 06/08/2009 LMP - Character: birth control     Enter LMP: 06/08/2009 Last PAP Result  Specimen Adequacy: Satisfactory for evaluation.   Squamous Cell: Low Grade squamous intraepithelial lesion (LSIL).     Primary Care Provider:  Lehman Prom FNP  CC:  vafinal discharge and skin rash on chest area, Vaginal discharge, and Rash.  History of Present Illness:  Pt into the office with complaints of vaginal discharge and rash.    Vaginal discharge      This is a 25 year old woman who presents with Vaginal discharge.  The symptoms began 2 weeks ago.  The severity is described as mild.  Discharge started after her last period.  The patient complains of burning on urination and frequency, but denies itching, urgency, and fever.  The discharge is described as white.  Associated symptoms include rash.  The patient denies the following symptoms: genital sores.  Prior treatment has included no prior treatment.    Rash      The patient also presents with Rash.  The symptoms began 1 week ago.  Hives after she eats certain foods No pets.  No other family members present.  The patient complains of papules, but denies macules.  The rash is located on the chest and abdomen.  The patient denies the following symptoms: fever and headache.  Area on right chest started first then all the other areas  followed   Allergies (verified): 1)  ! Codeine  Review of Systems CV:  Denies chest pain or discomfort. Resp:  Denies cough. GI:  Denies abdominal pain. GU:  Complains of discharge. Derm:  Complains of rash.  Physical Exam  General:  alert.   Head:  normocephalic.   Genitalia:  self wet prep Skin:  right upper chest - circular lesion, central clearing smaller oblong lesions on upper chest and abd Psych:  Oriented X3.     Impression & Recommendations:  Problem # 1:  TINEA CORPORIS (ICD-110.5) will treat for tinea on right chest advised pt to apply ointment with a qtip use dove soap  Problem # 2:  VAGINAL DISCHARGE (ICD-623.5) advise good hygiene Orders: UA Dipstick w/o Micro (manual) ([address]109) KOH/ WET Mount [phone]) T-Comprehensive Metabolic Panel [phone]) T-CBC w/Diff [phone]) T-Lipid Profile [phone]) T-TSH ([id number]) Rapid HIV  (02725) T-Syphilis Test (RPR) ([id number])  Complete Medication List: 1)  Nystatin-triamcinolone 100000-0.1 Unit/gm-% Crea (Nystatin-triamcinolone) .... One application topically to affected area until healed  Patient Instructions: 1)  Schedule an appointment for a complete physcial exam 2)  Do not eat after midnight before this visit 3)  Check for resolution of rash. 4)  Drink plenty of water 5)  White cotton underwear 6)  No douching 7)  Use ointment on affected area  until healed 8)  Use Dove soap on body Prescriptions: NYSTATIN-TRIAMCINOLONE 100000-0.1 UNIT/GM-% CREA (NYSTATIN-TRIAMCINOLONE) One application topically to affected area until healed  #60mg  x 0   Entered and Authorized by:   Lehman Prom FNP   Signed by:   Lehman Prom FNP on 06/30/2009   Method used:   Print then Give to Patient   RxID:   [id number]   Laboratory Results   Urine Tests  Date/Time Received: June 30, 2009 10:55 AM  Date/Time Reported: June 30, 2009 10:55 AM   Routine Urinalysis   Color: lt. yellow Appearance:  Clear Glucose: negative   (Normal Range: Negative) Bilirubin: negative   (Normal Range: Negative) Ketone: negative   (Normal Range: Negative) Spec. Gravity: 1.015   (Normal Range: 1.003-1.035) Blood: negative   (Normal Range: Negative) pH: 6.5   (Normal Range: 5.0-8.0) Protein: negative   (Normal Range: Negative) Urobilinogen: 0.2   (Normal Range: 0-1) Nitrite: negative   (Normal Range: Negative) Leukocyte Esterace: negative   (Normal Range: Negative)        Laboratory Results   Urine Tests    Routine Urinalysis   Color: lt. yellow Appearance: Clear Glucose: negative   (Normal Range: Negative) Bilirubin: negative   (Normal Range: Negative) Ketone: negative   (Normal Range: Negative) Spec. Gravity: 1.015   (Normal Range: 1.003-1.035) Blood: negative   (Normal Range: Negative) pH: 6.5   (Normal Range: 5.0-8.0) Protein: negative   (Normal Range: Negative) Urobilinogen: 0.2   (Normal Range: 0-1) Nitrite: negative   (Normal Range: Negative) Leukocyte Esterace: negative   (Normal Range: Negative)       Appended Document: Vaginal discharge/Rash  Laboratory Results    Other Tests  Rapid HIV: negative

## 2010-05-02 NOTE — Letter (Signed)
Summary: Work Excuse  [facility]  [address] [place], [place] [address]   Phone: [phone]  Fax: [phone]    Today's Date: January 05, 2010  Name of Patient: Michaela Carrillo  The above named patient had a medical visit today   Please take this into consideration when reviewing the time away from work  Special Instructions:  [  ] None  [ X ] To be off the remainder of today, returning to the normal work schedule tomorrow.  [  ] To be off until the next scheduled appointment on ______________________.  [  ] Other ________________________________________________________________ ________________________________________________________________________   Sincerely yours,   Lehman Prom FNP

## 2010-05-02 NOTE — Assessment & Plan Note (Signed)
Summary: Acute - Shin Splints   Vital Signs:  Patient profile:   25 year old female Height:      65 inches Weight:      277.1 pounds BMI:     46.28 Temp:     97.7 degrees F oral Pulse rate:   62 / minute Pulse rhythm:   regular Resp:     16 per minute BP sitting:   111 / 58  (left arm) Cuff size:   large  Vitals Entered By: Levon Hedger (January 05, 2010 3:16 PM)  Nutrition Counseling: Patient's BMI is greater than 25 and therefore counseled on weight management options. CC: office visit for pain in both legs, cramping, onset two days, prior activities before pain was excessive walking,no current medications taken Is Patient Diabetic? No Pain Assessment Patient in pain? yes     Location: legs Intensity: 8 Type: aching/cramping  Does patient need assistance? Functional Status Self care Ambulation Normal   Primary Care Provider:  Lehman Prom FNP  CC:  office visit for pain in both legs, cramping, onset two days, prior activities before pain was excessive walking, and no current medications taken.  History of Present Illness:  Pt into the office with complaints of bil leg pain Started on yesterday (Wednesday) prior to onset of pain she went to a football game and walked up and down bleachers. On yesterday when she woke pain was present in both legs. Calf muscles are affected. no discoloration of legs  Obesity - down 4 pounds since her last visit. She is trying to lose weight.  Pt is still employed at [employer].  She is employed as a Advertising copywriter.  Allergies (verified): 1)  ! Codeine  Review of Systems CV:  Denies chest pain or discomfort. Resp:  Denies cough. GI:  Denies abdominal pain, nausea, and vomiting. MS:  Complains of muscle; calf muscles.  Physical Exam  General:  alert.   Head:  normocephalic.   Neurologic:  alert & oriented X3.   Skin:  color normal.   Psych:  Oriented X3.     Impression & Recommendations:  Problem # 1:  SHIN SPLINTS  (ICD-844.9) advised pt of the dx she can take ibuprofen as needed if pain continues handout given  Complete Medication List: 1)  Nystatin-triamcinolone 100000-0.1 Unit/gm-% Crea (Nystatin-triamcinolone) .... One application topically to affected area until healed 2)  Ferrous Sulfate 325 (65 Fe) Mg Tabs (Ferrous sulfate) .... One tablet by mouth two times a day  Patient Instructions: 1)  Pain is likely due to walking up and down the bleachers. 2)  This should be better tomorrow. 3)  You can take ibuprofen as needed for pain.  Prevention & Chronic Care Immunizations   Influenza vaccine: given at work  (01/05/2010)    Tetanus booster: 04/03/2003: historical per pt    Pneumococcal vaccine: Not documented  Other Screening   Pap smear:  Specimen Adequacy: Satisfactory for evaluation.   Interpretation/Result:Negative for intraepithelial Lesion or Malignancy.     (11/30/2009)   Pap smear action/deferral: PAP smear done  (04/28/2008)   Pap smear due: 12/2010   Smoking status: never  (11/30/2009)

## 2010-05-04 NOTE — Progress Notes (Signed)
Summary: Constant cough  Phone Note Call from Patient   Summary of Call: coughing up green stuff/has a chest cold /2 day now / coughing alot///[phone] push 0 and have her paged Initial call taken by: Arta Bruce,  March 31, 2010 8:18 AM  Follow-up for Phone Call        Started couple of days ago, coughing productive of thick green mucus with some blood-tinged during last night.  Coughing mostly at night, sleeps flat with one pillow, having trouble sleeping..  Chest hurts from coughing, denies SOB.   Denies fever, nasal congestion, nasal drainage, nausea/vomiting, headache.  Having some indigestion.  Throat is sore, denies trouble swallowing.  Has tried Nyquil -- which helped loosen chest congestion, but did not quiet cough.  Advised to humidfy home, drink plenty of water, elevate head at night -- home management per cold protocol -- use Robitussin DM for suppressing cough, Mucinex to loosen congestion.  To call back if symptoms persist or worsen.  To f/u with UC or ED if suddenly has trouble or develops rapid breathing, becomes very weak, fever is very high. Follow-up by: Dutch Quint RN,  March 31, 2010 3:06 PM

## 2010-05-04 NOTE — Progress Notes (Signed)
Summary: Rx cough medication  Phone Note Call from Patient   Summary of Call: PT WENT TO GET COUGH MED (OTC) BUT SHE HAS A FLEXABLE SPENDING ACCOUNT AND THE CARD WILL PAY FOR HER COUGH MED IF THE DR. Erskin Burnet A SCRIPT  ///[facility] [address]  [phone]//  THE PHARMACY LADY TOLD HER THAT Initial call taken by: Arta Bruce,  April 05, 2010 4:41 PM  Follow-up for Phone Call        Cough has improved, throat is still sore, but getting better.  Advised to continue with OTC cough relievers -- since she is improving, does not seem to need Rx.  Continue drinking plenty of water, humidify home, avoid spicy foods and acidic drinks.  Verbalized understanding. Follow-up by: Dutch Quint RN,  April 06, 2010 3:29 PM

## 2010-06-23 ENCOUNTER — Emergency Department (HOSPITAL_COMMUNITY)
Admission: EM | Admit: 2010-06-23 | Discharge: 2010-06-23 | Disposition: A | Discharge status: Home / Self Care | Payer: Self-pay | Attending: Emergency Medicine | Admitting: Emergency Medicine

## 2010-06-23 DIAGNOSIS — M545 Low back pain, unspecified: Secondary | ICD-10-CM | POA: Insufficient documentation

## 2010-06-23 DIAGNOSIS — R197 Diarrhea, unspecified: Secondary | ICD-10-CM | POA: Insufficient documentation

## 2010-06-23 DIAGNOSIS — R112 Nausea with vomiting, unspecified: Secondary | ICD-10-CM | POA: Insufficient documentation

## 2010-06-23 LAB — DIFFERENTIAL
Basophils Absolute: 0 10*3/uL (ref 0.0–0.1)
Eosinophils Absolute: 0.1 10*3/uL (ref 0.0–0.7)
Lymphocytes Relative: 21 % (ref 12–46)
Lymphs Abs: 2.5 10*3/uL (ref 0.7–4.0)
Neutro Abs: 8.6 10*3/uL — ABNORMAL HIGH (ref 1.7–7.7)

## 2010-06-23 LAB — POCT PREGNANCY, URINE: Preg Test, Ur: NEGATIVE

## 2010-06-23 LAB — URINALYSIS, ROUTINE W REFLEX MICROSCOPIC
Nitrite: NEGATIVE
Protein, ur: NEGATIVE mg/dL
pH: 6 (ref 5.0–8.0)

## 2010-06-23 LAB — CBC
MCHC: 28 g/dL — ABNORMAL LOW (ref 30.0–36.0)
RDW: 18.9 % — ABNORMAL HIGH (ref 11.5–15.5)

## 2010-07-07 LAB — URINALYSIS, ROUTINE W REFLEX MICROSCOPIC
Ketones, ur: NEGATIVE mg/dL
Leukocytes, UA: NEGATIVE
Nitrite: NEGATIVE
Protein, ur: NEGATIVE mg/dL
Urobilinogen, UA: 2 mg/dL — ABNORMAL HIGH (ref 0.0–1.0)

## 2010-07-07 LAB — WET PREP, GENITAL: Yeast Wet Prep HPF POC: NONE SEEN

## 2010-07-07 LAB — CBC
HCT: 35.2 % — ABNORMAL LOW (ref 36.0–46.0)
Hemoglobin: 11 g/dL — ABNORMAL LOW (ref 12.0–15.0)
MCHC: 31.1 g/dL (ref 30.0–36.0)
RDW: 19.9 % — ABNORMAL HIGH (ref 11.5–15.5)

## 2010-07-07 LAB — POCT PREGNANCY, URINE: Preg Test, Ur: NEGATIVE

## 2010-07-13 LAB — POCT PREGNANCY, URINE: Preg Test, Ur: NEGATIVE

## 2010-08-18 NOTE — Discharge Summary (Signed)
NAME:  Michaela Carrillo, Michaela Carrillo NO.:  [id number]   MEDICAL RECORD NO.:  [id number]                   PATIENT TYPE:  INP   LOCATION:  9310                                 FACILITY:  [facility]   PHYSICIAN:  Kathreen Cosier, M.D.           DATE OF BIRTH:  06-Nov-1985   DATE OF ADMISSION:  05/03/2003  DATE OF DISCHARGE:                                 DISCHARGE SUMMARY   HOSPITAL COURSE:  The patient is a 25 year old primigravida with an EDC of  July 03, 2003; she is 31 weeks pregnancy.  History of nausea and vomiting  from her first trimester.  Had been on Zofran.  The patient had a history of  urinary tract infection and given Macrobid but unable to take it 2 weeks  ago.  She was admitted with nausea and vomiting, urine protein greater than  300.  Blood pressures in the office were all normal until January 31 when  they were 132/83, 150/100, 153/90, 158/103; uric acid 4.9, alk phos 369, LDH  156, platelets greater than 300.  She was admitted and started on ampicillin  2 g IV q.6h.  A 24-hour urine protein collection was greater than 2700,  creatinine normal.  She was given betamethasone 12.5 IM x2 over 24 hours.  The patient's magnesium sulfate was discontinued and she was observed, and  her blood pressures were basically in normal range.  Ultrasound gave an  estimated fetal weight of 1800 g and that it was a breech.  Her uric acid  and PIH labs all remained normal.  On May 05, 2003 she had gained 10  pounds in weight and by May 06, 2003 she had gained another 10 pounds -  she put on 20 pounds in 24 hours, and her urine output decreased to 30  mL/hour.  It was decided that her preeclampsia was becoming worse and she  was breech presentation at 31 weeks so she was delivered by C-section, had a  female, Apgars 1 and 7, cord pH 7.31, weighing 4 pounds 3 ounces.  Postoperatively, on postoperative day #1 she diuresed well.  Her hemoglobin  was 6.5 and her weight on  day #1 was 284 pounds.  Chest x-ray was negative.  By day #2 blood pressure was 150/80, output good, potassium 4, uric acid 5.  Her magnesium sulfate was discontinued.  She had been placed on magnesium  sulfate after her C-section.  The patient had a bowel movement and positive  gas.  By day #2 her potassium was 4.4, blood pressure 150/60 to 80, and she  weighed 290 pounds.  She was started on hydrochlorothiazide 50 mg p.o.  daily.  February 7, day #4, blood pressure 150/85 to 86, she has put out  2700 mL.  Weight was down to 281.  Sodium was 139, potassium 4.7, chloride  104, hemoglobin 5.9.  Her abdomen was soft.  The incision was clean, dry,  and intact.  Her legs  had 2+ edema.  She was ambulating well and without  complaints.  She was discharged home on May 10, 2003 on  hydrochlorothiazide 50 mg p.o. daily, Tylox for pain, ferrous sulfate for  her anemia, and Yasmin for contraception.  To see me on Thursday morning,  May 13, 2003, to be weighed.   DISCHARGE DIAGNOSIS:  Status post preeclampsia at 31 weeks with cesarean  section.                                               Kathreen Cosier, M.D.    BAM/MEDQ  D:  05/10/2003  T:  05/10/2003  Job:  [id number]

## 2010-08-18 NOTE — H&P (Signed)
NAME:  Michaela Carrillo, Michaela Carrillo NO.:  [id number]   MEDICAL RECORD NO.:  [id number]                   PATIENT TYPE:  INP   LOCATION:  9158                                 FACILITY:  [facility]   PHYSICIAN:  Kathreen Cosier, M.D.           DATE OF BIRTH:  01-Sep-1985   DATE OF ADMISSION:  05/03/2003  DATE OF DISCHARGE:                                HISTORY & PHYSICAL   HISTORY OF PRESENT ILLNESS:  The patient is a 25 year old primigravida with  [facility] July 03, 2003 making her 31 weeks 7 days.  She had a history of nausea  and vomiting throughout this pregnancy, has been on Zofran.  She also had a  urinary tract infection, given Macrobid, but unable to tolerate it.  The  patient was admitted with nausea, vomiting.  Urine showed negative nitrites.  She had no abdominal pain but she had greater than 300 of protein and her  blood pressures which were all normal were now 132/83, 151/100, 153/90,  160/103.  Uric acid was 4.9; alk phos 369; total protein 7; platelets  greater than 300,000; LDH 156.  She was started on ampicillin 2 g IV for a  urinary tract infection, given betamethasone 12.5 mg IM q.24h. for two  doses.  The patient did not have any nausea and vomiting and 24-hour urine  collection showed creatinine clearance of 202 and the protein was 2281.  She  was also placed on Aldomet and magnesium sulfate was discontinued on  February 1.  She had 2+ edema and her PIH labs remained the same.  Between  February 1 and February 2 she gained 10 pounds and her output was good.  Between February 2 and February 3 she gained another 10 pounds for a total  of 20 pounds in 48 hours.  Her urine output which was greater than 40  mL/hour now fell to 30 mL/hour and it was decided that she would be  delivered by C-section because of severe preeclampsia, breech presentation,  cervix long and closed.   PHYSICAL EXAMINATION:  GENERAL:  Revealed a female weighing 292 pounds.  LUNGS:   Clear.  HEART:  Regular rhythm, no murmurs, no gallops.  ABDOMEN:  Uterus about 32-34 weeks size.  EXTREMITIES:  She had 3+ edema.                                               Kathreen Cosier, M.D.    BAM/MEDQ  D:  05/06/2003  T:  05/06/2003  Job:  [id number]

## 2010-08-18 NOTE — Procedures (Signed)
NAME:  Michaela Carrillo, Michaela Carrillo NO.:  [id number]   MEDICAL RECORD NO.:  [id number]          PATIENT TYPE:  OUT   LOCATION:  SLEEP CENTER                 [facility]ACILITY[facility]   PHYSICIAN:  Clinton D. Maple Hudson, MD, FCCP, FACPDATE OF BIRTH:  02-25-1984   DATE OF STUDY:  05/30/2006                            NOCTURNAL POLYSOMNOGRAM   INDICATION FOR STUDY:  Hypersomnia with sleep apnea.   EPWORTH SLEEPINESS SCORE:  15/24, BMI 48.9, weight 287 pounds.   MEDICATIONS:  Patient listed no medications.   SLEEP ARCHITECTURE:  Total sleep time 406 minutes with sleep efficiency  95%.  Stage I was 3%, stage II 68%, stages III and IV 13%, REM 16% of  total sleep time.  Sleep latency 3 minutes, REM latency 141 minutes,  awake after sleep onset was 21 minutes, arousal index 4.  No bedtime  medication was taken.   RESPIRATORY DATA:  Apnea-hypopnea index (AHI, RDI) 10 obstructive events  per hour, indicating mild obstructive sleep apnea/hypopnea syndrome.  There were 14 listed as central apneas, 11 obstructive apneas and 43  hypopneas.  Events were positional, mostly but not exclusively recorded  while supine.  REM AHI 23.1.  There were insufficient respiratory events  to permit CPAP titration by split protocol on this study night.   OXYGEN DATA:  Technician noted no snoring with oxygen desaturation to  nadir of 83%.  Mean oxygen saturation through the study was 96% on room  air.   CARDIAC DATA:  Normal sinus rhythm.   MOVEMENT-PARASOMNIA:  Occasional limb jerks with arousal 1.5 per hour,  doubtfully significant.  One bathroom trip.   IMPRESSIONS-RECOMMENDATIONS:  1. Mild obstructive sleep apnea/hypopnea syndrome, apnea-hypopnea      index 10 per hour, with events more common while supine.  No      snoring was noted by the technician.  Oxygen desaturation to a      nadir of 83%.  2. There were insufficient events to permit CPAP titration by split      protocol on this study night.  A score  of 10 per      hour is indeterminate for recommended CPAP therapy.  Conservative      measures including encouragement to sleep off flat of back, weight      loss and treatment for any substantial nasal congestion may be      considered first.      Clinton D. Maple Hudson, MD, [facility], FACP  Diplomate, Biomedical engineer of Sleep Medicine  Electronically Signed     CDY/MEDQ  D:  06/02/2006 11:11:21  T:  06/02/2006 17:15:59  Job:  [id number]

## 2010-08-18 NOTE — Op Note (Signed)
NAME:  Michaela Carrillo, Michaela Carrillo                     ACCOUNT NO.:  [id number]   MEDICAL RECORD NO.:  [id number]                   PATIENT TYPE:  INP   LOCATION:  9372                                 FACILITY:  [facility]   PHYSICIAN:  Kathreen Cosier, M.D.           DATE OF BIRTH:  1985-05-28   DATE OF PROCEDURE:  05/06/2003  DATE OF DISCHARGE:                                 OPERATIVE REPORT   PREOPERATIVE DIAGNOSES:  1. Severe preeclampsia at 31 weeks.  2. Breech presentation.  3. Oliguria.   SURGEON:  Kathreen Cosier, M.D.   FIRST ASSISTANT:  Charles A. Clearance Coots, M.D.   DESCRIPTION OF PROCEDURE:  The patient placed on the operating room after  spinal is administered.  Abdomen prepped and draped.  Bladder emptied with  Foley catheter.  A transverse suprapubic incision made and carried down to  the rectus fascia.  Fascia cleaned and incised the length of the incision.  Recti muscles were retracted laterally.  Peritoneum incised longitudinally.  Transverse incision made in the visceral peritoneum above the bladder and  the bladder mobilized inferiorly.  Transverse lower uterine incision made.  The patient was delivered of a double footling breech female, Apgars 1 and  7.  There was some difficulty encountered in delivering the head.  The  placenta was anterior and removed manually.  Uterine cavity cleaned with dry  laps.  Cord pH was 7.31.  The uterine cavity cleaned with dry laps.  Hemostasis was satisfactory.  Uterine incision closed with one layer of  continuous suture of #1 chromic.  Bladder flap reattached, uterus well  contracted, tubes and ovaries normal.  Abdomen closed in layers, peritoneum  with continuous suture of 0 chromic, fascia with continuous suture of 0  Dexon and the skin closed with subcuticular stitch of 3-0 Monocryl.   ESTIMATED BLOOD LOSS:  400 mL.                                               Kathreen Cosier, M.D.    BAM/MEDQ  D:  05/06/2003  T:   05/06/2003  Job:  [id number]

## 2010-08-18 NOTE — Discharge Summary (Signed)
   NAME:  Michaela Carrillo, Michaela Carrillo NO.:  [id number]   MEDICAL RECORD NO.:  [id number]                   PATIENT TYPE:  INP   LOCATION:  9304                                 FACILITY:  [facility]   PHYSICIAN:  Kathreen Cosier, M.D.           DATE OF BIRTH:  [birth year]   DATE OF ADMISSION:  12/22/2002  DATE OF DISCHARGE:  12/28/2002                                 DISCHARGE SUMMARY   The patient is a 25 year old prima gravida.  Last menstrual period September 26, 2002.  Hospitalized on September 1 weighing 240 pounds, now she weighs 220  with severe hyperemesis.  On admission, urine was negative.  She was on IV  Phenergan.  Potassium was 2.8 and she was given 40 mEq of Kay Ciel to each  IV.  Pharmacy consult was obtained and she was placed on steroid run.  By  September 24, her potassium was back to normal.  Her Janna Arch was  discontinued.  The patient's weight suddenly went from 223 to 233 and over a  course of 48 hours and the scale was discarded.  She felt better and on  September 27 was discharged home on Medrol 8 mg in the morning, 4 mg midday  for two days, then 2 mg in the a.m. for two days, and 1 mg in the a.m. x2  days.  She was also given Phenergan suppositories.   DISCHARGE DIAGNOSIS:  Status post hospitalization for hyperemesis.                                               Kathreen Cosier, M.D.    BAM/MEDQ  D:  01/20/2003  T:  01/20/2003  Job:  [id number]

## 2010-12-25 LAB — WET PREP, GENITAL
Trich, Wet Prep: NONE SEEN
Yeast Wet Prep HPF POC: NONE SEEN

## 2010-12-25 LAB — DIFFERENTIAL
Basophils Relative: 0
Eosinophils Absolute: 0.3
Lymphocytes Relative: 13
Neutro Abs: 11.7 — ABNORMAL HIGH

## 2010-12-25 LAB — BASIC METABOLIC PANEL
BUN: 12
CO2: 25
Chloride: 99
Creatinine, Ser: 0.62
Glucose, Bld: 102 — ABNORMAL HIGH

## 2010-12-25 LAB — COMPREHENSIVE METABOLIC PANEL
Alkaline Phosphatase: 85
BUN: 13
CO2: 26
Chloride: 103
GFR calc non Af Amer: >60
Glucose, Bld: 97
Potassium: 3.9
Total Bilirubin: 0.4
Total Protein: 7.3

## 2010-12-25 LAB — URINALYSIS, ROUTINE W REFLEX MICROSCOPIC
Glucose, UA: NEGATIVE
Ketones, ur: NEGATIVE
Protein, ur: NEGATIVE

## 2010-12-25 LAB — CBC
Hemoglobin: 10.3 — ABNORMAL LOW
MCHC: 31.3
RBC: 5.22 — ABNORMAL HIGH

## 2011-01-09 LAB — POCT URINALYSIS DIP (DEVICE)
Glucose, UA: NEGATIVE
Ketones, ur: NEGATIVE
Operator id: 126491
Specific Gravity, Urine: 1.02

## 2011-08-25 ENCOUNTER — Emergency Department (HOSPITAL_COMMUNITY)
Admission: EM | Admit: 2011-08-25 | Discharge: 2011-08-25 | Disposition: A | Discharge status: Home / Self Care | Payer: Self-pay | Attending: Emergency Medicine | Admitting: Emergency Medicine

## 2011-08-25 ENCOUNTER — Emergency Department (HOSPITAL_COMMUNITY): Payer: Self-pay

## 2011-08-25 ENCOUNTER — Encounter (HOSPITAL_COMMUNITY): Payer: Self-pay

## 2011-08-25 DIAGNOSIS — S92353A Displaced fracture of fifth metatarsal bone, unspecified foot, initial encounter for closed fracture: Secondary | ICD-10-CM

## 2011-08-25 DIAGNOSIS — X500XXA Overexertion from strenuous movement or load, initial encounter: Secondary | ICD-10-CM | POA: Insufficient documentation

## 2011-08-25 DIAGNOSIS — Y9302 Activity, running: Secondary | ICD-10-CM | POA: Insufficient documentation

## 2011-08-25 DIAGNOSIS — Z79899 Other long term (current) drug therapy: Secondary | ICD-10-CM | POA: Insufficient documentation

## 2011-08-25 DIAGNOSIS — S92309A Fracture of unspecified metatarsal bone(s), unspecified foot, initial encounter for closed fracture: Secondary | ICD-10-CM | POA: Insufficient documentation

## 2011-08-25 DIAGNOSIS — M79609 Pain in unspecified limb: Secondary | ICD-10-CM | POA: Insufficient documentation

## 2011-08-25 DIAGNOSIS — M7989 Other specified soft tissue disorders: Secondary | ICD-10-CM | POA: Insufficient documentation

## 2011-08-25 MED ORDER — IBUPROFEN 600 MG PO TABS: 600.0000 mg | ORAL_TABLET | Freq: Three times a day (TID) | ORAL | Status: AC | PRN

## 2011-08-25 MED ORDER — HYDROCODONE-ACETAMINOPHEN 5-500 MG PO TABS: 1.0000 | ORAL_TABLET | Freq: Four times a day (QID) | ORAL | Status: AC | PRN

## 2011-08-25 MED ORDER — IBUPROFEN 200 MG PO TABS
600.0000 mg | ORAL_TABLET | Freq: Once | ORAL | Status: AC
  Administered 2011-08-25: 600 mg via ORAL
  Filled 2011-08-25: qty 3

## 2011-08-25 MED ORDER — OXYCODONE-ACETAMINOPHEN 5-325 MG PO TABS
1.0000 | ORAL_TABLET | Freq: Once | ORAL | Status: AC
Start: 2011-08-25 — End: 2011-08-25
  Administered 2011-08-25: 1 via ORAL
  Filled 2011-08-25: qty 1

## 2011-08-25 NOTE — Progress Notes (Signed)
Orthopedic Tech Progress Note Patient Details:  Michaela Carrillo Jul 31, 1985 [id number]  Ortho Devices Type of Ortho Device: Postop boot;Crutches Ortho Device/Splint Location: left foot Ortho Device/Splint Interventions: Application   Michaela Carrillo 08/25/2011, 1:14 PM

## 2011-08-25 NOTE — ED Provider Notes (Signed)
History   This chart was scribed for [name], MD by Melba Coon. The patient was seen in room STRE5/STRE5 and the patient's care was started at 12:05PM.    CSN: [id number]  Arrival date & time 08/25/11  1101   First MD Initiated Contact with Patient 08/25/11 1150      Chief Complaint  Patient presents with  . Foot Pain    (Consider location/radiation/quality/duration/timing/severity/associated sxs/prior treatment) HPI Michaela Carrillo is a 26 y.o. female who presents to the Emergency Department complaining of constant, moderate to severe left ankle pain with an onset last night. Pt states that she twisted her ankle running around and playing with children last night. Ibuprofen sligthly alleviates the pain. No HA, fever, neck pain, sore throat, rash, back pain, CP, SOB, abd pain, n/v/d, dysuria, or extremity weakness, numbness, or tingling. Allergic to codeine. No other pertinent medical symptoms.   History reviewed. No pertinent past medical history.  History reviewed. No pertinent past surgical history.  No family history on file.  History  Substance Use Topics  . Smoking status: Never Smoker   . Smokeless tobacco: Not on file  . Alcohol Use: No    OB History    Grav Para Term Preterm Abortions TAB SAB Ect Mult Living                  Review of Systems 10 Systems reviewed and all are negative for acute change except as noted in the HPI.   Allergies  Codeine  Home Medications   Current Outpatient Rx  Name Route Sig Dispense Refill  . CETIRIZINE HCL 10 MG PO TABS Oral Take 10 mg by mouth daily as needed. allergies      BP 108/60  Pulse 55  Temp(Src) 97.7 F (36.5 C) (Oral)  Resp 18  SpO2 100%  LMP 08/24/2011  Physical Exam  Nursing note and vitals reviewed. Constitutional: She is oriented to person, place, and time. She appears well-developed and well-nourished. No distress.  HENT:  Head: Normocephalic and atraumatic.  Eyes: EOM are  normal.  Neck: Neck supple. No tracheal deviation present.  Cardiovascular: Normal rate.        Left lower extremity: nml dp and tp pulses  Pulmonary/Chest: Effort normal. No respiratory distress.  Musculoskeletal: Normal range of motion. She exhibits tenderness (Left ankle: lateral aspect with tenderness at base of 5th metatarsal).       No medial or lateral malloelus tenderness. No tenderness at proximal fibula  Neurological: She is alert and oriented to person, place, and time.  Skin: Skin is warm and dry.  Psychiatric: She has a normal mood and affect. Her behavior is normal.    ED Course  Procedures (including critical care time)  DIAGNOSTIC STUDIES: Oxygen Saturation is 100% on room air, normal by my interpretation.    COORDINATION OF CARE:  12:08PM- EDMD will order a left foot XR for the pt. 1:00PM - EDMD reviewed imaging results and radiologist interpretation. Pt has a fx in affected area. Crutches and post-op shoe will be ordered for the pt. Pt advised to f/u with orthopedist. Pt ready for d/c   Labs Reviewed - No data to display Dg Foot Complete Left  08/25/2011  *RADIOLOGY REPORT*  Clinical Data: Fall.  Left foot pain.  Swelling.  LEFT FOOT - COMPLETE 3+ VIEW  Comparison: None.  Findings: Lisfranc joint appears normal.  A very subtle transverse lucency along the base of the fifth metatarsal is suspicious for nondisplaced  avulsion fracture.  Slightly flattened longitudinal arch of the foot may be incidental.  Small Achilles calcaneal spur noted.  IMPRESSION:  1.  Subtle linear lucency along the posterior tip of the base of the fifth metatarsal, suspicious for nondisplaced avulsion fracture.  Correlate with tenderness in this vicinity. 2.  Slightly flattened longitudinal arch of the foot could be from pes planus or could be incidental - standing lateral view of the foot is more specific for pes planus.  Original Report Authenticated By: Dellia Cloud, M.D.     1.  Fracture of 5th metatarsal       MDM   I personally performed the services described in this documentation, which was scribed in my presence. The recorded information has been reviewed and considered.           [name], MD 08/25/11 1308

## 2011-08-25 NOTE — ED Notes (Signed)
Larey Seat yesterday has a lt. Foot injury.  Lt,. Foot is swollen and bruised

## 2011-08-25 NOTE — Discharge Instructions (Signed)
Foot Fracture       A fractured foot is a broken bone in your foot. These fractures are usually caused by twisting or crush injuries. Some foot fractures are stress fractures which are due to excess walking or exercise. If the bones are in a good position, foot fractures will usually heal in about 6 weeks. You should keep your foot elevated for the next 2 to 4 days and apply ice packs to the area of the injury for 20 to 30 minutes every 2 to 3 hours until the swelling and pain get better.   If you have been placed in a cast or splint, keep it on until you have been checked by your caregiver. Do not walk on a broken foot until bearing weight is relatively painless. Often a cast or podiatric shoe with a stiff sole is used to allow early walking. Repeat X-rays are often needed in 3 to 6 weeks to make sure the fracture is healing.   Follow up with your caregiver as recommended.   SEEK IMMEDIATE MEDICAL CARE IF:   You have increased pain, or your toes become cold, numb, or pale.  MAKE SURE YOU:   Understand these instructions.   Will watch your condition.   Will get help right away if you are not doing well or get worse.  Document Released: 04/26/2004 Document Revised: 03/08/2011 Document Reviewed: 04/21/2008   [employer] Patient Information 2012 [employer]

## 2011-09-12 ENCOUNTER — Other Ambulatory Visit: Payer: Self-pay | Admitting: Internal Medicine

## 2011-12-17 ENCOUNTER — Encounter (HOSPITAL_COMMUNITY): Payer: Self-pay | Admitting: Emergency Medicine

## 2011-12-17 ENCOUNTER — Emergency Department (INDEPENDENT_AMBULATORY_CARE_PROVIDER_SITE_OTHER)
Admission: EM | Admit: 2011-12-17 | Discharge: 2011-12-17 | Disposition: A | Discharge status: Home / Self Care | Payer: Self-pay | Source: Home / Self Care | Attending: Family Medicine | Admitting: Family Medicine

## 2011-12-17 DIAGNOSIS — R109 Unspecified abdominal pain: Secondary | ICD-10-CM

## 2011-12-17 LAB — POCT I-STAT, CHEM 8
BUN: 16 mg/dL (ref 6–23)
Creatinine, Ser: 0.9 mg/dL (ref 0.50–1.10)
Hemoglobin: 10.9 g/dL — ABNORMAL LOW (ref 12.0–15.0)
Potassium: 3.9 mEq/L (ref 3.5–5.1)
Sodium: 139 mEq/L (ref 135–145)

## 2011-12-17 LAB — POCT URINALYSIS DIP (DEVICE)
Bilirubin Urine: NEGATIVE
Glucose, UA: NEGATIVE mg/dL
Hgb urine dipstick: NEGATIVE
Ketones, ur: NEGATIVE mg/dL
Leukocytes, UA: NEGATIVE
pH: 6.5 (ref 5.0–8.0)

## 2011-12-17 MED ORDER — ONDANSETRON 4 MG PO TBDP
4.0000 mg | ORAL_TABLET | Freq: Once | ORAL | Status: AC
  Administered 2011-12-17: 4 mg via ORAL

## 2011-12-17 MED ORDER — ONDANSETRON 4 MG PO TBDP
ORAL_TABLET | ORAL | Status: AC
  Filled 2011-12-17: qty 1

## 2011-12-17 MED ORDER — ONDANSETRON HCL 4 MG PO TABS: 4.0000 mg | ORAL_TABLET | Freq: Four times a day (QID) | ORAL | Status: DC

## 2011-12-17 MED ORDER — GI COCKTAIL ~~LOC~~
ORAL | Status: AC
  Filled 2011-12-17: qty 30

## 2011-12-17 MED ORDER — GI COCKTAIL ~~LOC~~
30.0000 mL | Freq: Once | ORAL | Status: AC
  Administered 2011-12-17: 30 mL via ORAL

## 2011-12-17 NOTE — ED Notes (Signed)
C/o nausea, feeling lightheaded onset about one week ago.

## 2011-12-17 NOTE — ED Provider Notes (Signed)
History     CSN: [id number]  Arrival date & time 12/17/11  1851   First MD Initiated Contact with Patient 12/17/11 1958      Chief Complaint  Patient presents with  . Nausea    (Consider location/radiation/quality/duration/timing/severity/associated sxs/prior treatment) Patient is a 26 y.o. female presenting with abdominal pain. The history is provided by the patient.  Abdominal Pain The primary symptoms of the illness include abdominal pain and nausea. The primary symptoms of the illness do not include fever, vomiting, diarrhea, vaginal discharge or vaginal bleeding. The current episode started more than 2 days ago. The onset of the illness was gradual. The problem has not changed since onset. The patient states that she believes she is currently not pregnant. The patient has not had a change in bowel habit. Symptoms associated with the illness do not include urgency, frequency or back pain.    History reviewed. No pertinent past medical history.  History reviewed. No pertinent past surgical history.  No family history on file.  History  Substance Use Topics  . Smoking status: Never Smoker   . Smokeless tobacco: Not on file  . Alcohol Use: No    OB History    Grav Para Term Preterm Abortions TAB SAB Ect Mult Living                  Review of Systems  Constitutional: Negative.  Negative for fever.  HENT: Negative.   Respiratory: Negative.   Cardiovascular: Negative.   Gastrointestinal: Positive for nausea and abdominal pain. Negative for vomiting and diarrhea.  Genitourinary: Negative for urgency, frequency, vaginal bleeding and vaginal discharge.  Musculoskeletal: Negative for back pain.    Allergies  Codeine  Home Medications   Current Outpatient Rx  Name Route Sig Dispense Refill  . CETIRIZINE HCL 10 MG PO TABS Oral Take 10 mg by mouth daily as needed. allergies    . ONDANSETRON HCL 4 MG PO TABS Oral Take 1 tablet (4 mg total) by mouth every 6 (six) hours.  6 tablet 0    BP 113/65  Pulse 55  Temp 99.1 F (37.3 C) (Oral)  Resp 18  SpO2 100%  LMP 11/29/2011  Physical Exam  Nursing note and vitals reviewed. Constitutional: She is oriented to person, place, and time. She appears well-developed and well-nourished.  HENT:  Mouth/Throat: Oropharynx is clear and moist.  Neck: Normal range of motion. Neck supple.  Pulmonary/Chest: Breath sounds normal.  Abdominal: Soft. Bowel sounds are normal. She exhibits no distension and no mass. There is tenderness. There is no rebound and no guarding.  Lymphadenopathy:    She has no cervical adenopathy.  Neurological: She is alert and oriented to person, place, and time.  Skin: Skin is warm and dry.    ED Course  Procedures (including critical care time)  Labs Reviewed  POCT I-STAT, CHEM 8 - Abnormal; Notable for the following:    Hemoglobin 10.9 (*)     HCT 32.0 (*)     All other components within normal limits  POCT URINALYSIS DIP (DEVICE)  POCT PREGNANCY, URINE   No results found.   1. Abdominal pain       MDM          Linna Hoff, MD 12/17/11 2109

## 2012-07-02 ENCOUNTER — Encounter (HOSPITAL_COMMUNITY): Payer: Self-pay | Admitting: *Deleted

## 2012-07-02 ENCOUNTER — Emergency Department (INDEPENDENT_AMBULATORY_CARE_PROVIDER_SITE_OTHER)
Admission: EM | Admit: 2012-07-02 | Discharge: 2012-07-02 | Disposition: A | Discharge status: Home / Self Care | Payer: No Typology Code available for payment source | Source: Home / Self Care

## 2012-07-02 DIAGNOSIS — K219 Gastro-esophageal reflux disease without esophagitis: Secondary | ICD-10-CM

## 2012-07-02 MED ORDER — OMEPRAZOLE 40 MG PO CPDR: 40.0000 mg | DELAYED_RELEASE_CAPSULE | Freq: Every day | ORAL | Status: DC

## 2012-07-02 NOTE — ED Notes (Signed)
Vitals entered at time 1315 are in error. Those stats are for a different patient.

## 2012-07-02 NOTE — ED Notes (Signed)
Patient presents to establish care. Also patient complains of acid reflux x 1 month.

## 2012-07-02 NOTE — ED Notes (Addendum)
Patient Demographics  Michaela Carrillo, is a 27 y.o. female  JYN:[id number]  QMV:[id number]  DOB - 21-Sep-1985  Chief Complaint  Patient presents with  . Establish Care        Subjective:   Michaela Carrillo today here to get treatment for reflux symptoms, she says she is getting a lot of heartburn especially at night if she had a heavy meal, no chest or abdominal pain no shortness of breath, no loss of appetite, no dark colored stools.  Objective:   History reviewed. No pertinent past medical history.    History reviewed. No pertinent past surgical history.   Filed Vitals:   07/02/12 1241  BP: 109/64  Pulse: 62  Temp: 98.6 F (37 C)  TempSrc: Oral  Resp: 16  SpO2: 100%     Exam  Awake Alert, Oriented X 3, No new F.N deficits, Normal affect [place].AT,PERRAL Supple Neck,No JVD, No cervical lymphadenopathy appriciated.  Symmetrical Chest wall movement, Good air movement bilaterally, CTAB RRR,No Gallops,Rubs or new Murmurs, No Parasternal Heave +ve B.Sounds, Abd Soft, Non tender, No organomegaly appriciated, No rebound - guarding or rigidity. No Cyanosis, Clubbing or edema, No new Rash or bruise       Data Review   CBC No results found for this basename: WBC, HGB, HCT, PLT, MCV, MCH, MCHC, RDW, NEUTRABS, LYMPHSABS, MONOABS, EOSABS, BASOSABS, BANDABS, BANDSABD,  in the last 168 hours  Chemistries   No results found for this basename: NA, K, CL, CO2, GLUCOSE, BUN, CREATININE, GFRCGP, CALCIUM, MG, AST, ALT, ALKPHOS, BILITOT,  in the last 168 hours ------------------------------------------------------------------------------------------------------------------ No results found for this basename: HGBA1C,  in the last 72 hours ------------------------------------------------------------------------------------------------------------------ No results found for this basename: CHOL, HDL, LDLCALC, TRIG, CHOLHDL, LDLDIRECT,  in the last 72  hours ------------------------------------------------------------------------------------------------------------------ No results found for this basename: TSH, T4TOTAL, FREET3, T3FREE, THYROIDAB,  in the last 72 hours ------------------------------------------------------------------------------------------------------------------ No results found for this basename: VITAMINB12, FOLATE, FERRITIN, TIBC, IRON, RETICCTPCT,  in the last 72 hours  Coagulation profile  No results found for this basename: INR, PROTIME,  in the last 168 hours     Prior to Admission medications   Medication Sig Start Date End Date Taking? Authorizing Provider  cetirizine (ZYRTEC) 10 MG tablet Take 10 mg by mouth daily as needed. allergies    Historical Provider, MD  omeprazole (PRILOSEC) 40 MG capsule Take 1 capsule (40 mg total) by mouth daily. 07/02/12   Leroy Sea, MD  ondansetron (ZOFRAN) 4 MG tablet Take 1 tablet (4 mg total) by mouth every 6 (six) hours. 12/17/11   Linna Hoff, MD     Assessment & Plan   GERD. Patient given prescription for PPI, advice to eat meals 2 hours before going to bed, advice to keep head propped up, advice on heart healthy low-fat diet and exercise 30 minutes a day 4 times a week at least and to lose weight.  She needs her annual Pap smear in August advice to come back a month before that for 30 minute visit for a Pap smear. Advise to use protection i.e. condoms if having sex with strangers.    Follow-up Information   Follow up with this clinic. Schedule an appointment as soon as possible for a visit in 1 month. (30 min visit for PAP smear)        Leroy Sea M.D on 07/02/2012 at 1:09 PM   Leroy Sea, MD 07/02/12 1310  Leroy Sea, MD 07/02/12 1311

## 2012-07-03 NOTE — ED Notes (Signed)
[facility]

## 2012-07-18 ENCOUNTER — Encounter (HOSPITAL_COMMUNITY): Payer: Self-pay | Admitting: *Deleted

## 2012-07-18 ENCOUNTER — Emergency Department (HOSPITAL_COMMUNITY)
Admission: EM | Admit: 2012-07-18 | Discharge: 2012-07-18 | Disposition: A | Discharge status: Home / Self Care | Payer: No Typology Code available for payment source | Source: Home / Self Care

## 2012-07-18 DIAGNOSIS — L219 Seborrheic dermatitis, unspecified: Secondary | ICD-10-CM

## 2012-07-18 DIAGNOSIS — L253 Unspecified contact dermatitis due to other chemical products: Secondary | ICD-10-CM

## 2012-07-18 MED ORDER — CLOTRIMAZOLE-BETAMETHASONE 1-0.05 % EX CREA: TOPICAL_CREAM | CUTANEOUS | Status: DC

## 2012-07-18 NOTE — ED Provider Notes (Signed)
History     CSN: [id number]  Arrival date & time 07/18/12  1520   First MD Initiated Contact with Patient 07/18/12 1607      Chief Complaint  Patient presents with  . Hair/Scalp Problem    (Consider location/radiation/quality/duration/timing/severity/associated sxs/prior treatment) HPI Comments: 27 year old black female is complaining of a scalp rash. She has been wearing her hair in tight braids in wearing a wig over a period recently she developed spots of hair loss with shiny skin, scaling and pruritus. 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 Her stylist has been using various chemicals on her hair and scalp. Long history of dandruff and seborrhea. She is also concerned about possible fungal infection.   History reviewed. No pertinent past medical history.  Past Surgical History  Procedure Laterality Date  . Cesarean section  2005    Family History  Problem Relation Age of Onset  . Hypertension Mother   . Diabetes Mother   . Hypertension Father     History  Substance Use Topics  . Smoking status: Never Smoker   . Smokeless tobacco: Not on file  . Alcohol Use: No    OB History   Grav Para Term Preterm Abortions TAB SAB Ect Mult Living                  Review of Systems  Constitutional: Negative.   HENT: Negative for facial swelling.   Eyes: Negative.   Respiratory: Negative.   Genitourinary: Negative.   Skin:       As per history of present illness  Neurological: Negative.     Allergies  Codeine  Home Medications   Current Outpatient Rx  Name  Route  Sig  Dispense  Refill  . cetirizine (ZYRTEC) 10 MG tablet   Oral   Take 10 mg by mouth daily as needed. allergies         . clotrimazole-betamethasone (LOTRISONE) cream      Apply to affected area 2 times daily prn   45 g   0   . omeprazole (PRILOSEC) 40 MG capsule   Oral   Take 1 capsule (40 mg total) by mouth daily.   30 capsule   2   . ondansetron (ZOFRAN)  4 MG tablet   Oral   Take 1 tablet (4 mg total) by mouth every 6 (six) hours.   6 tablet   0     BP 116/61  Pulse 73  Temp(Src) 98.3 F (36.8 C) (Oral)  Resp 20  SpO2 100%  LMP 06/01/2012  Physical Exam  Nursing note and vitals reviewed. Constitutional: She is oriented to person, place, and time. She appears well-developed and well-nourished. No distress.  Neck: Normal range of motion. Neck supple.  Cardiovascular: Normal rate.   Pulmonary/Chest: Effort normal. No respiratory distress.  Musculoskeletal: She exhibits no edema and no tenderness.  Neurological: She is alert and oriented to person, place, and time. She exhibits normal muscle tone.  Skin: Skin is warm and dry. Rash noted.  Vertex of the scalp is the area most affected. There is loss of hair and breakage of hair with shiny moist skin. In the scalp along the braided lines there is also pruritus and scaling of the skin.  Psychiatric: She has a normal mood and affect.    ED Course  Procedures (including critical care time)  Labs Reviewed - No data to display No results found.   1. Acute seborrheic dermatitis  2. Contact dermatitis due to chemicals       MDM  Lotrisone Cream apply to scalp twice a day. It is not unreasonable to consider fungal etiology for some of the signs and symptoms of the scalp. Selsun Blue applied to scalp once or twice a week. When washing hair be sure to dry completely and use hairdryer. If not improving followup with the doctor at the [facility]. Do not apply additional chemicals.  Hayden Rasmussen, NP 07/18/12 [phone]

## 2012-07-18 NOTE — ED Provider Notes (Signed)
Medical screening examination/treatment/procedure(s) were performed by non-physician practitioner and as supervising physician I was immediately available for consultation/collaboration.  Jayro Mcmath, M.D.  Lynda Capistran C Orlyn Odonoghue, MD 07/18/12 2112 

## 2012-07-18 NOTE — ED Notes (Signed)
C/o scalp irritation and itching onset 4 weeks ago.  Took the weave out of hair yesterday and noted  bald spot on top of head,  approx 3 " diameter.

## 2012-07-28 ENCOUNTER — Ambulatory Visit (INDEPENDENT_AMBULATORY_CARE_PROVIDER_SITE_OTHER): Payer: No Typology Code available for payment source | Admitting: Obstetrics & Gynecology

## 2012-07-28 ENCOUNTER — Encounter: Payer: Self-pay | Admitting: Obstetrics & Gynecology

## 2012-07-28 VITALS — BP 114/65 | HR 67 | Temp 97.4°F | Ht 63.0 in | Wt 258.0 lb

## 2012-07-28 DIAGNOSIS — D239 Other benign neoplasm of skin, unspecified: Secondary | ICD-10-CM

## 2012-07-28 DIAGNOSIS — Z01419 Encounter for gynecological examination (general) (routine) without abnormal findings: Secondary | ICD-10-CM

## 2012-07-28 DIAGNOSIS — D229 Melanocytic nevi, unspecified: Secondary | ICD-10-CM

## 2012-07-28 DIAGNOSIS — Z0000 Encounter for general adult medical examination without abnormal findings: Secondary | ICD-10-CM

## 2012-07-28 LAB — TSH: TSH: 1.253 u[IU]/mL (ref 0.350–4.500)

## 2012-07-28 NOTE — Progress Notes (Signed)
Referral to dermatology has to come from [facility] per [facility] because of "orange card" holder. Patient was given information and phone number. Patient agrees.

## 2012-07-28 NOTE — Addendum Note (Signed)
Addended by: Allie Bossier on: 07/28/2012 03:51 PM   Modules accepted: Orders

## 2012-07-28 NOTE — Progress Notes (Addendum)
Subjective:    Michaela Carrillo is a 27 y.o. female who presents for an annual exam.  The patient has no complaints today. The patient is not currently sexually active, for the last month . GYN screening history: last pap: was normal at [facility]. The patient wears seatbelts: yes. The patient participates in regular exercise: no. Has the patient ever been transfused or tattooed?: no. The patient reports that there is not domestic violence in her life.   Menstrual History: OB History   Grav Para Term Preterm Abortions TAB SAB Ect Mult Living   2 1 0 1 1 0 0 0 0 1       Menarche age: 32  Patient's last menstrual period was 07/02/2012.    The following portions of the patient's history were reviewed and updated as appropriate: allergies, current medications, past family history, past medical history, past social history, past surgical history and problem list.  Review of Systems A comprehensive review of systems was negative.  Recently broke up with boyfriend, used condoms sometimes. Doesn't want any birth control today. Works at [employer] in [place].   Objective:    BP 114/65  Pulse 67  Temp(Src) 97.4 F (36.3 C) (Oral)  Ht 5\' 3"  (1.6 m)  Wt 258 lb (117.028 kg)  BMI 45.71 kg/m2  LMP 07/02/2012  General Appearance:    Alert, cooperative, no distress, appears stated age  Head:    Normocephalic, without obvious abnormality, atraumatic  Eyes:    PERRL, conjunctiva/corneas clear, EOM's intact, fundi    benign, both eyes  Ears:    Normal TM's and external ear canals, both ears  Nose:   Nares normal, septum midline, mucosa normal, no drainage    or sinus tenderness  Throat:   Lips, mucosa, and tongue normal; teeth and gums normal  Neck:   Supple, symmetrical, trachea midline, no adenopathy;    thyroid:  no enlargement/tenderness/nodules; no carotid   bruit or JVD  Back:     Symmetric, no curvature, ROM normal, no CVA tenderness  Lungs:     Clear to  auscultation bilaterally, respirations unlabored  Chest Wall:    No tenderness or deformity   Heart:    Regular rate and rhythm, S1 and S2 normal, no murmur, rub   or gallop  Breast Exam:    No tenderness, masses, or nipple abnormality  Abdomen:     Soft, non-tender, bowel sounds active all four quadrants,    no masses, no organomegaly, morbidly obese  Genitalia:    Normal female without lesion, discharge or tenderness     Extremities:   Extremities normal, atraumatic, no cyanosis or edema  Pulses:   2+ and symmetric all extremities  Skin:   Skin color, texture, turgor normal, no rashes or lesions  Lymph nodes:   Cervical, supraclavicular, and axillary nodes normal  Neurologic:   CNII-XII intact, normal strength, sensation and reflexes    throughout  .                        Her right arm has a 8 mm very dark, raised mole Assessment:    Healthy female exam.  Suspicious mole   Plan:     Thin prep Pap smear.  with cultures (mentioned a vaginal discharge), HPV cotesting Dermatology referal

## 2012-07-28 NOTE — Addendum Note (Signed)
Addended by: Allie Bossier on: 07/28/2012 04:06 PM   Modules accepted: Orders

## 2012-08-12 ENCOUNTER — Ambulatory Visit: Payer: No Typology Code available for payment source | Attending: Family Medicine | Admitting: Internal Medicine

## 2012-08-12 VITALS — BP 111/69 | HR 67 | Ht 65.0 in | Wt 233.0 lb

## 2012-08-12 DIAGNOSIS — R252 Cramp and spasm: Secondary | ICD-10-CM

## 2012-08-12 DIAGNOSIS — I10 Essential (primary) hypertension: Secondary | ICD-10-CM

## 2012-08-12 DIAGNOSIS — D649 Anemia, unspecified: Secondary | ICD-10-CM

## 2012-08-12 LAB — COMPREHENSIVE METABOLIC PANEL
ALT: 12 U/L (ref 0–35)
AST: 17 U/L (ref 0–37)
Albumin: 3.7 g/dL (ref 3.5–5.2)
CO2: 24 mEq/L (ref 19–32)
Calcium: 8.9 mg/dL (ref 8.4–10.5)
Chloride: 104 mEq/L (ref 96–112)
Creat: 0.73 mg/dL (ref 0.50–1.10)
Potassium: 3.9 mEq/L (ref 3.5–5.3)
Total Protein: 6.7 g/dL (ref 6.0–8.3)

## 2012-08-12 NOTE — Patient Instructions (Signed)
How to Take Your Blood Pressure  These instructions are only for electronic home blood pressure machines. You will need:   An automatic or semi-automatic blood pressure machine.  Fresh batteries for the blood pressure machine. HOW DO I USE THESE TOOLS TO CHECK MY BLOOD PRESSURE?   There are 2 numbers that make up your blood pressure. For example: 120/80.  The first number (120 in our example) is called the "systolic pressure." It is a measure of the pressure in your blood vessels when your heart is pumping blood.  The second number (80 in our example) is called the "diastolic pressure." It is a measure of the pressure in your blood vessels when your heart is resting between beats.  Before you buy a home blood pressure machine, check the size of your arm so you can buy the right size cuff. Here is how to check the size of your arm:  Use a tape measure that shows both inches and centimeters.  Wrap the tape measure around the middle upper part of your arm. You may need someone to help you measure right.  Write down your arm measurement in both inches and centimeters.  To measure your blood pressure right, it is important to have the right size cuff.  If your arm is up to 13 inches (37 to 34 centimeters), get an adult cuff size.  If your arm is 13 to 17 inches (35 to 44 centimeters), get a large adult cuff size.  If your arm is 17 to 20 inches (45 to 52 centimeters), get an adult thigh cuff.  Try to rest or relax for at least 30 minutes before you check your blood pressure.  Do not smoke.  Do not have any drinks with caffeine, such as:  Pop.  Coffee.  Tea.  Check your blood pressure in a quiet room.  Sit down and stretch out your arm on a table. Keep your arm at about the level of your heart. Let your arm relax. GETTING BLOOD PRESSURE READINGS  Make sure you remove any tight-fighting clothing from your arm. Wrap the cuff around your upper arm. Wrap it just above the bend,  and above where you felt the pulse. You should be able to slip a finger between the cuff and your arm. If you cannot slip a finger in the cuff, it is too tight and should be removed and rewrapped.  Some units requires you to manually pump up the arm cuff.  Automatic units inflate the cuff when you press a button.  Cuff deflation is automatic in both models.  After the cuff is inflated, the unit measures your blood pressure and pulse. The readings are displayed on a monitor. Hold still and breathe normally while the cuff is inflated.  Getting a reading takes less than a minute.  Some models store readings in a memory. Some provide a printout of readings.  Get readings at different times of the day. You should wait at least 5 minutes between readings. Take readings with you to your next doctor's visit. Document Released: 03/01/2008 Document Revised: 06/11/2011 Document Reviewed: 03/01/2008 [facility] Patient Information 2013 [place], [place].

## 2012-08-12 NOTE — Progress Notes (Signed)
Patient states she is here for her yearly check-up. When asked if she needed a pelvic exam she stated no. Patient states that she needs blood work.

## 2012-08-12 NOTE — Progress Notes (Signed)
Patient ID: Michaela Carrillo, female   DOB: 1985-06-29, 29 y.o.   MRN: [id number]  CC: blood work requested adn physical exam   HPI: Pt is 27 yo female with history of anemia and hypertension but not on medications, came in for BP and blood work check. No chest pain or shortness of breath, no recent sicknesses or hospitalizations, no abdominal or urinary concerns. Not taking any medications.   Allergies  Allergen Reactions  . Codeine     REACTION: itch   Past Medical History  Diagnosis Date  . Abnormal Pap smear 2012  . Preeclampsia    Current Outpatient Prescriptions on File Prior to Visit  Medication Sig Dispense Refill  . cetirizine (ZYRTEC) 10 MG tablet Take 10 mg by mouth daily as needed. allergies      . clotrimazole-betamethasone (LOTRISONE) cream Apply to affected area 2 times daily prn  45 g  0  . omeprazole (PRILOSEC) 40 MG capsule Take 1 capsule (40 mg total) by mouth daily.  30 capsule  2  . ondansetron (ZOFRAN) 4 MG tablet Take 1 tablet (4 mg total) by mouth every 6 (six) hours.  6 tablet  0   No current facility-administered medications on file prior to visit.   Family History  Problem Relation Age of Onset  . Hypertension Mother   . Diabetes Mother   . Hypertension Father    History   Social History  . Marital Status: Single    Spouse Name: N/A    Number of Children: N/A  . Years of Education: N/A   Occupational History  . Not on file.   Social History Main Topics  . Smoking status: Never Smoker   . Smokeless tobacco: Never Used  . Alcohol Use: No  . Drug Use: No  . Sexually Active: Not Currently    Birth Control/ Protection: None   Other Topics Concern  . Not on file   Social History Narrative  . No narrative on file    Review of Systems  Constitutional: Negative for fever, chills, diaphoresis, activity change, appetite change and fatigue.  HENT: Negative for ear pain, nosebleeds, congestion, facial swelling, rhinorrhea, neck pain, neck  stiffness and ear discharge.   Eyes: Negative for pain, discharge, redness, itching and visual disturbance.  Respiratory: Negative for cough, choking, chest tightness, shortness of breath, wheezing and stridor.   Cardiovascular: Negative for chest pain, palpitations and leg swelling.  Gastrointestinal: Negative for abdominal distention.  Genitourinary: Negative for dysuria, urgency, frequency, hematuria, flank pain, decreased urine volume, difficulty urinating and dyspareunia.  Musculoskeletal: Negative for back pain, joint swelling, arthralgias and gait problem.  Neurological: Negative for dizziness, tremors, seizures, syncope, facial asymmetry, speech difficulty, weakness, light-headedness, numbness and headaches.  Hematological: Negative for adenopathy. Does not bruise/bleed easily.  Psychiatric/Behavioral: Negative for hallucinations, behavioral problems, confusion, dysphoric mood, decreased concentration and agitation.    Objective:   Filed Vitals:   08/12/12 1358  BP: 111/69  Pulse: 67    Physical Exam  Constitutional: Appears well-developed and well-nourished. No distress.  HENT: Normocephalic. External right and left ear normal. Oropharynx is clear and moist.  Eyes: Conjunctivae and EOM are normal. PERRLA, no scleral icterus.  Neck: Normal ROM. Neck supple. No JVD. No tracheal deviation. No thyromegaly.  CVS: RRR, S1/S2 +, no murmurs, no gallops, no carotid bruit.  Pulmonary: Effort and breath sounds normal, no stridor, rhonchi, wheezes, rales.  Abdominal: Soft. BS +,  no distension, tenderness, rebound or guarding.  Musculoskeletal: Normal range  of motion. No edema and no tenderness.  Lymphadenopathy: No lymphadenopathy noted, cervical, inguinal. Neuro: Alert. Normal reflexes, muscle tone coordination. No cranial nerve deficit. Skin: Skin is warm and dry. No rash noted. Not diaphoretic. No erythema. No pallor.  Psychiatric: Normal mood and affect. Behavior, judgment, thought  content normal.   Lab Results  Component Value Date   WBC 11.9* 06/23/2010   HGB 10.9* 12/17/2011   HCT 32.0* 12/17/2011   MCV 66.7* 06/23/2010   PLT 424* 06/23/2010   Lab Results  Component Value Date   CREATININE 0.90 12/17/2011   BUN 16 12/17/2011   NA 139 12/17/2011   K 3.9 12/17/2011   CL 106 12/17/2011   CO2 24 06/30/2009    No results found for this basename: HGBA1C      Assessment:   Patient Active Problem List   Diagnosis Date Noted  . ANEMIA 07/04/2009        Plan:     - will check CBC today - also check BMP and TSH

## 2012-08-13 LAB — CBC
MCV: 61.3 fL — ABNORMAL LOW (ref 78.0–100.0)
Platelets: 436 10*3/uL — ABNORMAL HIGH (ref 150–400)
RBC: 4.83 MIL/uL (ref 3.87–5.11)
WBC: 11.5 10*3/uL — ABNORMAL HIGH (ref 4.0–10.5)

## 2012-08-13 LAB — TSH: TSH: 1.316 u[IU]/mL (ref 0.350–4.500)

## 2012-09-24 ENCOUNTER — Ambulatory Visit: Payer: No Typology Code available for payment source | Attending: Family Medicine | Admitting: Internal Medicine

## 2012-09-24 ENCOUNTER — Other Ambulatory Visit (HOSPITAL_COMMUNITY)
Admission: RE | Admit: 2012-09-24 | Discharge: 2012-09-24 | Disposition: A | Discharge status: Home / Self Care | Payer: No Typology Code available for payment source | Source: Ambulatory Visit | Attending: Family Medicine | Admitting: Family Medicine

## 2012-09-24 VITALS — BP 118/73 | HR 71 | Temp 97.9°F | Resp 16 | Wt 255.2 lb

## 2012-09-24 DIAGNOSIS — Z113 Encounter for screening for infections with a predominantly sexual mode of transmission: Secondary | ICD-10-CM

## 2012-09-24 DIAGNOSIS — N760 Acute vaginitis: Secondary | ICD-10-CM | POA: Insufficient documentation

## 2012-09-24 LAB — HIV ANTIBODY (ROUTINE TESTING W REFLEX): HIV: NONREACTIVE

## 2012-09-24 NOTE — Progress Notes (Unsigned)
Patient states her ex boyfriend told her she has an STD Has had un-protected intercourse Complain of pain around the navel with some discharge Has some white creamy discharge from vagina as well

## 2012-09-24 NOTE — Progress Notes (Unsigned)
Patient ID: Michaela Carrillo, female   DOB: 1985/08/10, 27 y.o.   MRN: [id number] Patient Demographics  Michaela Carrillo, is a 27 y.o. female  [id number]  [id number]  DOB - 06-26-1985  Chief Complaint  Patient presents with  . Exposure to STD        Subjective:   Michaela Carrillo today is here for a follow up visit. Patient has No headache, No chest pain, No abdominal pain - No Nausea, No new weakness tingling or numbness, No Cough - SOB.  Patient presented to the clinic for screening of the STDs. Patient broke up with her boyfriend recently and concerned about any STD's. She denies any vaginal discharge or pruritus.  Objective:    Filed Vitals:   09/24/12 1520  BP: 118/73  Pulse: 71  Temp: 97.9 F (36.6 C)  Resp: 16  Weight: 255 lb 3.2 oz (115.758 kg)  SpO2: 100%     ALLERGIES:   Allergies  Allergen Reactions  . Codeine     REACTION: itch    PAST MEDICAL HISTORY: Past Medical History  Diagnosis Date  . Abnormal Pap smear 2012  . Preeclampsia     MEDICATIONS AT HOME: Prior to Admission medications   Medication Sig Start Date End Date Taking? Authorizing Provider  cetirizine (ZYRTEC) 10 MG tablet Take 10 mg by mouth daily as needed. allergies    Historical Provider, MD  clotrimazole-betamethasone (LOTRISONE) cream Apply to affected area 2 times daily prn 07/18/12   Hayden Rasmussen, NP  omeprazole (PRILOSEC) 40 MG capsule Take 1 capsule (40 mg total) by mouth daily. 07/02/12   Leroy Sea, MD  ondansetron (ZOFRAN) 4 MG tablet Take 1 tablet (4 mg total) by mouth every 6 (six) hours. 12/17/11   Linna Hoff, MD     Exam  General appearance :Awake, alert, NAD, Speech Clear.  HEENT: Atraumatic and Normocephalic, PERLA Neck: supple, no JVD. No cervical lymphadenopathy.  Chest: Clear to auscultation bilaterally, no wheezing, rales or rhonchi CVS: S1 S2 regular, no murmurs.  Abdomen: soft, NBS, NT, ND, no gaurding, rigidity or  rebound. Extremities: no cyanosis or clubbing, B/L Lower Ext shows no edema Neurology: Awake alert, and oriented X 3, CN II-XII intact, Non focal Skin: No Rash or lesions Wounds:N/A    Data Review   Basic Metabolic Panel: No results found for this basename: NA, K, CL, CO2, GLUCOSE, BUN, CREATININE, CALCIUM, MG, PHOS,  in the last 168 hours Liver Function Tests: No results found for this basename: AST, ALT, ALKPHOS, BILITOT, PROT, ALBUMIN,  in the last 168 hours  CBC: No results found for this basename: WBC, NEUTROABS, HGB, HCT, MCV, PLT,  in the last 168 hours  ------------------------------------------------------------------------------------------------------------------ No results found for this basename: HGBA1C,  in the last 72 hours ------------------------------------------------------------------------------------------------------------------ No results found for this basename: CHOL, HDL, LDLCALC, TRIG, CHOLHDL, LDLDIRECT,  in the last 72 hours ------------------------------------------------------------------------------------------------------------------ No results found for this basename: TSH, T4TOTAL, FREET3, T3FREE, THYROIDAB,  in the last 72 hours ------------------------------------------------------------------------------------------------------------------ No results found for this basename: VITAMINB12, FOLATE, FERRITIN, TIBC, IRON, RETICCTPCT,  in the last 72 hours  Coagulation profile  No results found for this basename: INR, PROTIME,  in the last 168 hours    Assessment & Plan   Active Problems: Screening for STDs - Check ancillary urine for chlamydia and gonorrhea, Trichomonas, Gardnerella, Candida - HIV, RPR  Patient explained that she will be called with any positive results and prescription. Her partner will need to be treated if  needed.  Follow-up in 3 months as needed     RAI,RIPUDEEP M.D. 09/24/2012, 3:36 PM

## 2012-09-26 NOTE — Progress Notes (Signed)
Quick Note:  Please inform patient that the urine ancillary tests came back negative for STDs. She did have a positive test for bacterial vaginosis. This is an overgrowth of bacteria of the vaginal flora. This is not an STD. We need to call in a prescription for antibiotics for her to take four-week to get rid of the discharge that she's been experiencing. Please call and a prescription for metronidazole 500 mg take 1 by mouth twice a day x7 days dispense #14, no refills.   Rodney Langton, MD, CDE, FAAFP [facility] [place], [place]   ______

## 2012-09-29 ENCOUNTER — Telehealth: Payer: Self-pay | Admitting: *Deleted

## 2012-09-29 NOTE — Telephone Encounter (Signed)
09/29/12 Patient made aware of lab  results  Urine test came back negative. She ahas a positive test for  Bacterial vaginosis will call in prescrition for antibiotic at [facility] [phone] [facility]

## 2013-03-23 ENCOUNTER — Inpatient Hospital Stay (HOSPITAL_COMMUNITY)
Admission: AD | Admit: 2013-03-23 | Discharge: 2013-03-23 | Disposition: A | Discharge status: Home / Self Care | Payer: Medicaid Other | Source: Ambulatory Visit | Attending: Obstetrics & Gynecology | Admitting: Obstetrics & Gynecology

## 2013-03-23 ENCOUNTER — Encounter (HOSPITAL_COMMUNITY): Payer: Self-pay | Admitting: *Deleted

## 2013-03-23 DIAGNOSIS — R109 Unspecified abdominal pain: Secondary | ICD-10-CM | POA: Insufficient documentation

## 2013-03-23 DIAGNOSIS — N946 Dysmenorrhea, unspecified: Secondary | ICD-10-CM

## 2013-03-23 DIAGNOSIS — Z3202 Encounter for pregnancy test, result negative: Secondary | ICD-10-CM | POA: Insufficient documentation

## 2013-03-23 LAB — URINALYSIS, ROUTINE W REFLEX MICROSCOPIC
Bilirubin Urine: NEGATIVE
Glucose, UA: NEGATIVE mg/dL
Hgb urine dipstick: NEGATIVE
Protein, ur: NEGATIVE mg/dL
Urobilinogen, UA: 0.2 mg/dL (ref 0.0–1.0)

## 2013-03-23 NOTE — MAU Provider Note (Signed)
Chief Complaint: Possible Pregnancy and Nausea   None    SUBJECTIVE HPI: Michaela Carrillo is a 27 y.o. G2P0111 who presents to maternity admissions reporting nausea x1 week, and missed period last week.  She had abdominal cramping a week ago but denies pain now.  Patient's last menstrual period was 02/23/2013.  She is sure of this date and reports 24 day cycles which are regular.  She denies LOF, vaginal bleeding, vaginal itching/burning, urinary symptoms, h/a, dizziness, n/v, or fever/chills.     Past Medical History  Diagnosis Date  . Abnormal Pap smear 2012  . Preeclampsia    Past Surgical History  Procedure Laterality Date  . Cesarean section  2005  . Colposcopy  2012   History   Social History  . Marital Status: Single    Spouse Name: N/A    Number of Children: N/A  . Years of Education: N/A   Occupational History  . Not on file.   Social History Main Topics  . Smoking status: Never Smoker   . Smokeless tobacco: Never Used  . Alcohol Use: No  . Drug Use: No  . Sexual Activity: Not Currently    Birth Control/ Protection: None   Other Topics Concern  . Not on file   Social History Narrative  . No narrative on file   No current facility-administered medications on file prior to encounter.   No current outpatient prescriptions on file prior to encounter.   Allergies  Allergen Reactions  . Codeine Itching    ROS: Pertinent items in HPI  OBJECTIVE Blood pressure 127/59, pulse 79, temperature 99 F (37.2 C), temperature source Oral, resp. rate 20, height 5\' 4"  (1.626 m), weight 123.923 kg (273 lb 3.2 oz), last menstrual period 02/23/2013, SpO2 100.00%. GENERAL: Well-developed, well-nourished female in no acute distress.  HEENT: Normocephalic HEART: normal rate RESP: normal effort ABDOMEN: Soft, non-tender EXTREMITIES: Nontender, no edema NEURO: Alert and oriented SPECULUM EXAM: Deferred  LAB RESULTS Results for orders placed during the hospital  encounter of 03/23/13 (from the past 24 hour(s))  POCT PREGNANCY, URINE     Status: None   Collection Time    03/23/13  6:48 PM      Result Value Range   Preg Test, Ur NEGATIVE  NEGATIVE    ASSESSMENT 1. Dysmenorrhea     PLAN Reviewed negative pregnancy test with pt Pt declines STD testing today Discharge home Take HPT if no menses in 1 week Return to MAU as needed    Medication List    Notice   You have not been prescribed any medications.     Follow-up Information   Follow up with [facility] [place] MATERNITY ADMISSIONS. (As needed)    Contact information:   [address] [id number] [place] [address] [phone]      Sharen Counter Certified Nurse-Midwife 03/23/2013  7:00 PM

## 2013-03-23 NOTE — MAU Note (Signed)
Patient states she has missed her period and feels nauseated, no vomiting. Had abdominal cramping last week none today. Denies bleeding or discharge.

## 2013-08-13 ENCOUNTER — Telehealth: Payer: Self-pay | Admitting: Internal Medicine

## 2013-08-13 NOTE — Telephone Encounter (Signed)
Pt called requesting a referral for the dentist, please contact pt

## 2013-08-18 ENCOUNTER — Telehealth: Payer: Self-pay | Admitting: Emergency Medicine

## 2013-08-18 NOTE — Telephone Encounter (Signed)
Pt informed she will receive dental referral at time of scheduled office visit 08/26/13

## 2013-08-22 ENCOUNTER — Encounter (HOSPITAL_COMMUNITY): Payer: Self-pay | Admitting: Emergency Medicine

## 2013-08-22 ENCOUNTER — Emergency Department (HOSPITAL_COMMUNITY)
Admission: EM | Admit: 2013-08-22 | Discharge: 2013-08-22 | Disposition: A | Discharge status: Home / Self Care | Payer: BC Managed Care – PPO | Attending: Emergency Medicine | Admitting: Emergency Medicine

## 2013-08-22 DIAGNOSIS — K089 Disorder of teeth and supporting structures, unspecified: Secondary | ICD-10-CM | POA: Insufficient documentation

## 2013-08-22 DIAGNOSIS — K029 Dental caries, unspecified: Secondary | ICD-10-CM | POA: Insufficient documentation

## 2013-08-22 DIAGNOSIS — K0889 Other specified disorders of teeth and supporting structures: Secondary | ICD-10-CM

## 2013-08-22 MED ORDER — NAPROXEN 500 MG PO TABS: 500.0000 mg | ORAL_TABLET | Freq: Two times a day (BID) | ORAL | Status: DC

## 2013-08-22 MED ORDER — HYDROCODONE-ACETAMINOPHEN 5-325 MG PO TABS: 1.0000 | ORAL_TABLET | Freq: Four times a day (QID) | ORAL | Status: DC | PRN

## 2013-08-22 MED ORDER — PENICILLIN V POTASSIUM 500 MG PO TABS: 500.0000 mg | ORAL_TABLET | Freq: Four times a day (QID) | ORAL | Status: AC

## 2013-08-22 NOTE — ED Notes (Signed)
Pt c/o dental pain to R upper molar side of mouth for about 2 weeks. Pt trying to get into see dentist but having difficulty. Pt has no acute distress.

## 2013-08-22 NOTE — Discharge Instructions (Signed)
Dental Pain °A tooth ache may be caused by cavities (tooth decay). Cavities expose the nerve of the tooth to air and hot or cold temperatures. It may come from an infection or abscess (also called a boil or furuncle) around your tooth. It is also often caused by dental caries (tooth decay). This causes the pain you are having. °DIAGNOSIS  °Your caregiver can diagnose this problem by exam. °TREATMENT  °· If caused by an infection, it may be treated with medications which kill germs (antibiotics) and pain medications as prescribed by your caregiver. Take medications as directed. °· Only take over-the-counter or prescription medicines for pain, discomfort, or fever as directed by your caregiver. °· Whether the tooth ache today is caused by infection or dental disease, you should see your dentist as soon as possible for further care. °SEEK MEDICAL CARE IF: °The exam and treatment you received today has been provided on an emergency basis only. This is not a substitute for complete medical or dental care. If your problem worsens or new problems (symptoms) appear, and you are unable to meet with your dentist, call or return to this location. °SEEK IMMEDIATE MEDICAL CARE IF:  °· You have a fever. °· You develop redness and swelling of your face, jaw, or neck. °· You are unable to open your mouth. °· You have severe pain uncontrolled by pain medicine. °MAKE SURE YOU:  °· Understand these instructions. °· Will watch your condition. °· Will get help right away if you are not doing well or get worse. °Document Released: 03/19/2005 Document Revised: 06/11/2011 Document Reviewed: 11/05/2007 °ExitCare® Patient Information ©2014 [employer]. °  Emergency Department Resource Guide °1) Find a Doctor and Pay Out of Pocket °Although you won't have to find out who is covered by your insurance plan, it is a good idea to ask around and get recommendations. You will then need to call the office and see if the doctor you have chosen will  accept you as a new patient and what types of options they offer for patients who are self-pay. Some doctors offer discounts or will set up payment plans for their patients who do not have insurance, but you will need to ask so you aren't surprised when you get to your appointment. ° °2) Contact Your Local Health Department °Not all health departments have doctors that can see patients for sick visits, but many do, so it is worth a call to see if yours does. If you don't know where your local health department is, you can check in your phone book. The CDC also has a tool to help you locate your state's health department, and many state websites also have listings of all of their local health departments. ° °3) Find a Walk-in Clinic °If your illness is not likely to be very severe or complicated, you may want to try a walk in clinic. These are popping up all over the country in pharmacies, drugstores, and shopping centers. They're usually staffed by nurse practitioners or physician assistants that have been trained to treat common illnesses and complaints. They're usually fairly quick and inexpensive. However, if you have serious medical issues or chronic medical problems, these are probably not your best option. ° °No Primary Care Doctor: °- Call Health Connect at  832-8000 - they can help you locate a primary care doctor that  accepts your insurance, provides certain services, etc. °- Physician Referral Service- 1-800-533-3463 ° °Chronic Pain Problems: °Organization           Address  Phone   Notes  [facility]  [phone] Patients need to be referred by their primary care doctor.   Medication Assistance: Organization         Address  Phone   Notes  [facility] [address] [place], [place] [address] [phone] --Must be a resident of [facility] -- Must have NO insurance coverage whatsoever (no Medicaid/ Medicare, etc.) -- The pt.  MUST have a primary care doctor that directs their care regularly and follows them in the community   [facility]  [phone]   [employer]  [phone]    Agencies that provide inexpensive medical care: Organization         Address  Phone   Notes  [place]  [phone]   [facility]    [phone]   [facility] [place], [place] [address] [phone]   [place] [address] [place] [phone]   Planned Parenthood    [phone]   [facility]    [phone]   [place] and [place] [address], [place] Phone:  [phone], Fax:  [phone] Hours of Operation:  9 am - 6 pm, M-F.  Also accepts Medicaid/Medicare and self-pay.  [facility] for [place] [address], [address], [place] Phone: [phone], Fax: [phone]. Hours of Operation:  8:30 am - 5:30 pm, M-F.  Also accepts Medicaid and self-pay.  [facility] [place] [address] [place] Phone: [phone]   [place], [place], [place] [phone], Ext. [phone] Mondays & Thursdays: 7-9 AM.  First 15 patients are seen on a first come, first serve basis.    [place] Providers:  Organization         Address  Phone   Notes  [facility] [address], [place] [phone] Also accepts self-pay patients.  [facility] [address], [place]  [phone]   [place], [address], [place] [phone]   [facility] [address], [place] [phone]   Lucianne Lei [address], [place]   [phone] Only accepts [place] patients after they have their name applied to their card.   Self-Pay (no insurance) in  [facility]:  Organization         Address  Phone   Notes  Sickle Cell Patients, [facility] [place] [phone]   [facility] [place] [phone]   [facility] [place]  [place], [place], [place] [phone]   [facility]/Dr. Osei-Bonsu  [address] [place] or [address], [place] [phone] Phone number for both [place] and [place] locations is the same.  [facility] [address] [place] [phone]   [facility] [address] [place] [address], [place] or [address] [phone]   [facility] [address], [place] [phone], phone; [phone], fax Sees patients 1st and 3rd Saturday of every month.  Must not qualify  for public or private insurance (i.e. Medicaid, Medicare, [place] Health Choice, Veterans' Benefits) • Household income should be no more than 200% of the poverty level •The clinic cannot treat you if you are pregnant or think you are pregnant • Sexually transmitted diseases are not treated at the clinic.  ° °Dental Care: °Organization         Address  Phone  Notes  °[facility] [address], [place] [phone] Accepts children up to age 21 who are enrolled in Medicaid or [place] Health Choice; pregnant women with a Medicaid card; and children who have applied for Medicaid or [place] Health Choice, but were declined, whose parents can pay a reduced fee at time of service.  °[facility] [place]  [address], [place] ([phone]) [phone] Accepts children up to age 21 who are enrolled in Medicaid or [place] Health Choice; pregnant women with a Medicaid card; and children who have applied for Medicaid or [place] Health Choice, but were declined, whose parents  can pay a reduced fee at time of service.  °[facility] ° [address], [place] ([phone]) [phone] Patients are seen by appointment only. Walk-ins are not accepted. [facility] will see patients 18 years of age and older. °Monday - Tuesday (8am-5pm) °Most Wednesdays (8:30-5pm) °$30 per visit, cash only  °[facility] ° [address], [place] ([phone]) [phone] Patients are seen by appointment only. Walk-ins are not accepted. [facility] will see patients 18 years of age and older. °One Wednesday Evening (Monthly: Volunteer Based).  $30 per visit, cash only  °[facility]  ([phone]) [phone] for adults; Children under age 4, call Graduate Pediatric Dentistry at [phone]. Children aged 4-14, please call [phone] to request a pediatric application. ° Dental services are provided in all areas of dental care including fillings, crowns and bridges, complete and partial dentures, implants, gum treatment, root canals, and extractions. Preventive care is also provided. Treatment is provided to both adults and children. °Patients are selected via a lottery and there is often a waiting list. °  °[facility] [address], °[place] ° (336) 763-8833 [email or web address] °  °[facility] [address], [place], [place] (336)723-1848, Ext. 123 Second and Fourth Thursday of each month, opens at 6:30 AM; Clinic ends at 9 AM.  Patients are seen on a first-come first-served basis, and a limited number are seen during each clinic.  ° °[facility] ° [address], [place],  ([phone]) [phone]   Eligibility Requirements °You must have lived in [place], [place], or [place] counties for at least the last three months. °  You cannot be eligible for state or federal sponsored healthcare insurance, including Veterans Administration, Medicaid, or Medicare. °  You generally cannot be eligible for healthcare  insurance through your employer.  °  How to apply: °Eligibility screenings are held every Tuesday and Wednesday afternoon from 1:00 pm until 4:00 pm. You do not need an appointment for the interview!  °[facility] [address], [place],  [phone]   °[facility]  336-342-8273   °[facility]  336-703-3100   °[facility]  336-570-6415   ° °Behavioral Health Resources in the Community: °Intensive Outpatient Programs °Organization         Address  Phone    Notes  °[facility] [address], [place], [place] [phone]   °[facility] [address], [place], [place] 336-832-9800   °[facility] [address], [place], [place] ° 336-882-2125   °[facility] [address],  °[place], [place] 1-800-853-5163 or 336-641-4981   °Substance Abuse Resources °Organization         Address  Phone  Notes  °[facility]  336-882-2125   °[facility]  336-784-9470   °[facility]  336-285-9073   °[facility]  336-845-3988   °Residential & Outpatient Substance Abuse Program  1-800-659-3381   °Psychological Services °Organization         Address  Phone  Notes  °[facility]  336- 832-9600   °[facility]  336- 378-7881   °[facility] [address], [place] 1-800-853-5163 or 336-641-4981   ° °Mobile Crisis Teams °Organization         Address  Phone  Notes  °[facility], Mobile Crisis Care Unit  1-877-626-1772   °Assertive °[facility] ° [address] [place], [place] 336-834-9664   °Sharon DeEsch [address] °[place] 336-554-5454   ° °Self-Help/Support Groups °Organization         Address  Phone             Notes  °[facility] [place] - variety of support groups  336- 373-1402 Call for more information  °Narcotics Anonymous (NA),  [facility] [address], °[place]  2 meetings at this location  ° °Residential Treatment Programs °Organization         Address  Phone  Notes  °[facility] [address],    °[place]  1-866-801-8205   °[facility] ° [address], [place], [place] [phone]   °[facility] [address], [place] 336-845-3988 Admissions: 8am-3pm M-F  °[facility] [address]    °[place], [place] 336-841-1104   °[facility] [address], [place], [place] 336-379-7146   °[facility] [address]  °[place], [place] 336-285-9073   °[facility] - Intensive Outpatient [address], , [place] 336-852-3033   °[facility] [address]  °[place], [place] 1-877-615-2722 or 336-784-9470   °[facility] ([facility]) [address] [place], [place] 336-227-7417 Accepts Medicaid  °[facility] [address]  ° [place] 1-800-659-3381 Substance Abuse/Addiction Treatment  ° °[place] [facility] Resources °Organization         Address  Phone  Notes  °[facility]  (888) 581-9988   °Julie Brannon, PhD [address] [place], [place]   (336) 349-5553 or (336) 951-0000   °[facility]   [address] °[place], [place] (336) 349-4454   °[facility] [address], [place], [place] (336) 342-8316 Insurance/Medicaid/sponsorship through [facility]  °[facility] [address]                                    [place], [place] (336) 342-8316 Therapy/tele-psych/case  °[facility] [address]  ° [place],  (336) 349-2233    °Dr. Arfeen  (336) 349-4544   °[facility] [place]  [employer] [place]   [facility] 1) [address],  °2) [address], [place] °3)  [address], [place] (336) 349-3220 °(336) 342-7768 ° °(336) 342-8140     °[place] Child Abuse Hotline (336) 342-1394 or (336) 342-3537 (After Hours)    ° °   °

## 2013-08-22 NOTE — ED Provider Notes (Signed)
CSN: [id number]     Arrival date & time 08/22/13  2005 History  This chart was scribed for non-physician practitioner, Antonietta Breach, PA-C working with Blanchard Kelch, MD by Einar Pheasant, ED scribe. This patient was seen in room WTR8/WTR8 and the patient's care was started at 9:12 PM.    Chief Complaint  Patient presents with  . Dental Pain   The history is provided by the patient. No language interpreter was used.   HPI Comments: Michaela Carrillo is a 28 y.o. female who presents to the Emergency Department complaining of worsening dental pain. She states that the pain has been intermittent for the past year but has been constant for the past 2 weeks. Pt states that the discomfort is "driving her crazy". She denies seeing a dentist due to insurance lapse. She reports taking Tylenol, orajel, and another OTC medication, with minimal relief. She describes the pain as throbbing. She denies any SOB, inability of open her jaw, inability to swallow, numbness, fever, or facial swelling.   Past Medical History  Diagnosis Date  . Abnormal Pap smear 2012  . Preeclampsia    Past Surgical History  Procedure Laterality Date  . Cesarean section  2005  . Colposcopy  2012   Family History  Problem Relation Age of Onset  . Hypertension Mother   . Diabetes Mother   . Hypertension Father    History  Substance Use Topics  . Smoking status: Never Smoker   . Smokeless tobacco: Never Used  . Alcohol Use: No   OB History   Grav Para Term Preterm Abortions TAB SAB Ect Mult Living   2 1 0 1 1 0 0 0 0 1      Review of Systems  Constitutional: Negative for fever.  HENT: Positive for dental problem. Negative for facial swelling and trouble swallowing.   Respiratory: Negative for shortness of breath.   Gastrointestinal: Negative for nausea.  All other systems reviewed and are negative.  Allergies  Codeine  Home Medications   Prior to Admission medications   Not on File   BP 117/57   Pulse 66  Temp(Src) 98.6 F (37 C) (Oral)  Resp 16  SpO2 100%  Physical Exam  Nursing note and vitals reviewed. Constitutional: She is oriented to person, place, and time. She appears well-developed and well-nourished. No distress.  Nontoxic/nonseptic appearing  HENT:  Head: Normocephalic and atraumatic.  Right Ear: External ear normal. No mastoid tenderness.  Left Ear: External ear normal. No mastoid tenderness.  Nose: Nose normal.  Mouth/Throat: Uvula is midline, oropharynx is clear and moist and mucous membranes are normal. No oral lesions. No trismus in the jaw. Abnormal dentition. Dental caries present. No dental abscesses or uvula swelling. No oropharyngeal exudate.    Uvula midline. Patient tolerating secretions without difficulty.  Eyes: Conjunctivae and EOM are normal. Pupils are equal, round, and reactive to light. No scleral icterus.  Neck: Normal range of motion. Neck supple.  No stridor. No nuchal rigidity or meningismus.  Cardiovascular: Normal rate, regular rhythm and intact distal pulses.   Pulses:      Radial pulses are 2+ on the left side.  Pulmonary/Chest: Effort normal. No respiratory distress.  Musculoskeletal: Normal range of motion.  Neurological: She is alert and oriented to person, place, and time.  GCS 15. Speech is goal oriented. Patient moves extremities without ataxia.  Skin: Skin is warm and dry. No rash noted. She is not diaphoretic. No erythema. No pallor.  Psychiatric: She  has a normal mood and affect. Her behavior is normal.    ED Course  Procedures   DIAGNOSTIC STUDIES: Oxygen Saturation is 100% on RA, normal by my interpretation.    COORDINATION OF CARE: 9:20 PM- Will prescribe antibiotics. Pt advised of plan for treatment and pt agrees.  Labs Review Labs Reviewed - No data to display  Imaging Review No results found.   EKG Interpretation None      MDM   Final diagnoses:  Dentalgia    Patient with toothache x 1 year,  worsening x 2 weeks. No gross abscess. Exam unconcerning for Ludwig's angina or spread of infection. Will treat with penicillin and pain medicine. Urged patient to follow-up with dentist. Referral and resource guide provided. Return precautions discussed. Patient agreeable to plan with no unaddressed concerns.  I personally performed the services described in this documentation, which was scribed in my presence. The recorded information has been reviewed and is accurate.   Filed Vitals:   08/22/13 2018  BP: 117/57  Pulse: 66  Temp: 98.6 F (37 C)  TempSrc: Oral  Resp: 16  SpO2: 100%     Antonietta Breach, PA-C 08/22/13 2141

## 2013-08-23 NOTE — ED Provider Notes (Signed)
Medical screening examination/treatment/procedure(s) were performed by non-physician practitioner and as supervising physician I was immediately available for consultation/collaboration.   EKG Interpretation None        Leilani Cespedes S Kortney Schoenfelder, MD 08/23/13 0000 

## 2013-08-26 ENCOUNTER — Ambulatory Visit: Payer: BC Managed Care – PPO | Attending: Internal Medicine | Admitting: Internal Medicine

## 2013-08-26 ENCOUNTER — Other Ambulatory Visit (HOSPITAL_COMMUNITY)
Admission: RE | Admit: 2013-08-26 | Discharge: 2013-08-26 | Disposition: A | Discharge status: Home / Self Care | Payer: BC Managed Care – PPO | Source: Ambulatory Visit | Attending: Internal Medicine | Admitting: Internal Medicine

## 2013-08-26 ENCOUNTER — Encounter: Payer: Self-pay | Admitting: Internal Medicine

## 2013-08-26 VITALS — BP 101/68 | HR 52 | Temp 98.0°F | Resp 14 | Ht 64.0 in | Wt 270.0 lb

## 2013-08-26 DIAGNOSIS — Z124 Encounter for screening for malignant neoplasm of cervix: Secondary | ICD-10-CM

## 2013-08-26 DIAGNOSIS — L538 Other specified erythematous conditions: Secondary | ICD-10-CM

## 2013-08-26 DIAGNOSIS — K089 Disorder of teeth and supporting structures, unspecified: Secondary | ICD-10-CM

## 2013-08-26 DIAGNOSIS — N760 Acute vaginitis: Secondary | ICD-10-CM | POA: Insufficient documentation

## 2013-08-26 DIAGNOSIS — K0889 Other specified disorders of teeth and supporting structures: Secondary | ICD-10-CM

## 2013-08-26 DIAGNOSIS — L304 Erythema intertrigo: Secondary | ICD-10-CM

## 2013-08-26 DIAGNOSIS — Z0000 Encounter for general adult medical examination without abnormal findings: Secondary | ICD-10-CM

## 2013-08-26 DIAGNOSIS — Z113 Encounter for screening for infections with a predominantly sexual mode of transmission: Secondary | ICD-10-CM | POA: Insufficient documentation

## 2013-08-26 DIAGNOSIS — Z01419 Encounter for gynecological examination (general) (routine) without abnormal findings: Secondary | ICD-10-CM | POA: Insufficient documentation

## 2013-08-26 LAB — COMPLETE METABOLIC PANEL WITH GFR
ALBUMIN: 3.9 g/dL (ref 3.5–5.2)
ALT: 9 U/L (ref 0–35)
AST: 18 U/L (ref 0–37)
Alkaline Phosphatase: 62 U/L (ref 39–117)
BUN: 13 mg/dL (ref 6–23)
CALCIUM: 8.9 mg/dL (ref 8.4–10.5)
CHLORIDE: 103 meq/L (ref 96–112)
CO2: 26 meq/L (ref 19–32)
Creat: 0.73 mg/dL (ref 0.50–1.10)
GFR, Est African American: >89 mL/min
GLUCOSE: 69 mg/dL — AB (ref 70–99)
POTASSIUM: 4.4 meq/L (ref 3.5–5.3)
Sodium: 138 mEq/L (ref 135–145)
Total Bilirubin: 0.3 mg/dL (ref 0.2–1.2)
Total Protein: 6.9 g/dL (ref 6.0–8.3)

## 2013-08-26 LAB — LIPID PANEL
Cholesterol: 155 mg/dL (ref 0–200)
HDL: 43 mg/dL (ref >39)
LDL Cholesterol: 98 mg/dL (ref 0–99)
TRIGLYCERIDES: 70 mg/dL (ref <150)
Total CHOL/HDL Ratio: 3.6 Ratio
VLDL: 14 mg/dL (ref 0–40)

## 2013-08-26 LAB — THYROID PANEL WITH TSH
Free Thyroxine Index: 2.8 (ref 1.0–3.9)
T3 Uptake: 32.2 % (ref 22.5–37.0)
T4, Total: 8.6 ug/dL (ref 5.0–12.5)
TSH: 1.724 u[IU]/mL (ref 0.350–4.500)

## 2013-08-26 LAB — HEMOGLOBIN A1C
Hgb A1c MFr Bld: 6.3 % — ABNORMAL HIGH (ref <5.7)
MEAN PLASMA GLUCOSE: 134 mg/dL — AB (ref <117)

## 2013-08-26 MED ORDER — NYSTATIN 100000 UNIT/GM EX POWD: CUTANEOUS | Status: DC

## 2013-08-26 NOTE — Progress Notes (Signed)
Pt was seen here a year ago. So today she is reestablish care.  Pt is here today to have a physical and pap smear.

## 2013-08-26 NOTE — Progress Notes (Signed)
Patient ID: Michaela Carrillo, female   DOB: 1986/02/06, 28 y.o.   MRN: [id number]  CC: annual physical  HPI: Patient reports today for a annual physical.  She reports that she has a rash under her stomach folds that is red and itchy.  She reports that she does monthly self breast examinations and tries to practice safe sex.  She denies any vaginal discharge, itching, lesions, or odors.  She would like to have STD testing today.   Allergies  Allergen Reactions  . Codeine Itching   Past Medical History  Diagnosis Date  . Abnormal Pap smear 2012  . Preeclampsia    Current Outpatient Prescriptions on File Prior to Visit  Medication Sig Dispense Refill  . HYDROcodone-acetaminophen (NORCO/VICODIN) 5-325 MG per tablet Take 1 tablet by mouth every 6 (six) hours as needed.  11 tablet  0  . penicillin v potassium (VEETID) 500 MG tablet Take 1 tablet (500 mg total) by mouth 4 (four) times daily.  40 tablet  0  . naproxen (NAPROSYN) 500 MG tablet Take 1 tablet (500 mg total) by mouth 2 (two) times daily.  30 tablet  0   No current facility-administered medications on file prior to visit.   Family History  Problem Relation Age of Onset  . Hypertension Mother   . Diabetes Mother   . Hypertension Father    History   Social History  . Marital Status: Single    Spouse Name: N/A    Number of Children: N/A  . Years of Education: N/A   Occupational History  . Not on file.   Social History Main Topics  . Smoking status: Never Smoker   . Smokeless tobacco: Never Used  . Alcohol Use: No  . Drug Use: No  . Sexual Activity: Not Currently    Birth Control/ Protection: None   Other Topics Concern  . Not on file   Social History Narrative  . No narrative on file   Review of Systems  Constitutional: Negative.   HENT: Negative.   Eyes: Negative.   Respiratory: Negative.   Cardiovascular: Negative.   Gastrointestinal: Positive for heartburn. Negative for nausea and vomiting.   Genitourinary: Negative.   Musculoskeletal: Negative.   Skin: Positive for itching and rash (right abdominal fold).  Neurological: Negative.   Psychiatric/Behavioral: Negative.       Objective:  There were no vitals filed for this visit. Physical Exam  Vitals reviewed. Constitutional: She is oriented to person, place, and time. She appears well-nourished.  HENT:  Right Ear: External ear normal.  Left Ear: External ear normal.  Mouth/Throat: Oropharynx is clear and moist.  Eyes: Conjunctivae and EOM are normal. Pupils are equal, round, and reactive to light.  Neck: Normal range of motion. Neck supple. No thyromegaly present.  Cardiovascular: Normal rate, regular rhythm, normal heart sounds and intact distal pulses.   Pulmonary/Chest: Effort normal and breath sounds normal.  Abdominal: Soft. Bowel sounds are normal. She exhibits no distension. There is no tenderness.  Genitourinary: Vagina normal and uterus normal. Cervix exhibits no motion tenderness, no discharge and no friability. Right adnexum displays no tenderness. Left adnexum displays no tenderness. No vaginal discharge found.  Musculoskeletal: Normal range of motion. She exhibits no edema and no tenderness.  Lymphadenopathy:    She has no cervical adenopathy.       Right: No inguinal adenopathy present.       Left: No inguinal adenopathy present.  Neurological: She is alert and oriented to person,  place, and time. She has normal reflexes. No cranial nerve deficit.  Skin: Skin is warm and dry. Rash (lower abdominal folds) noted.  Psychiatric: She has a normal mood and affect. Thought content normal.     Lab Results  Component Value Date   WBC 11.5* 08/12/2012   HGB 8.6* 08/12/2012   HCT 29.6* 08/12/2012   MCV 61.3* 08/12/2012   PLT 436* 08/12/2012   Lab Results  Component Value Date   CREATININE 0.73 08/12/2012   BUN 13 08/12/2012   NA 139 08/12/2012   K 3.9 08/12/2012   CL 104 08/12/2012   CO2 24 08/12/2012    No  results found for this basename: HGBA1C   Lipid Panel     Component Value Date/Time   CHOL 164 06/30/2009 2057   TRIG 72 06/30/2009 2057   HDL 37* 06/30/2009 2057   CHOLHDL 4.4 Ratio 06/30/2009 2057   VLDL 14 06/30/2009 2057   LDLCALC 113* 06/30/2009 2057       Assessment and plan:   Lysbeth was seen today for establish care.  Diagnoses and associated orders for this visit:  Annual physical exam - Lipid panel - CBC; Future - COMPLETE METABOLIC PANEL WITH GFR - Thyroid Panel With TSH - Vitamin D, 25-hydroxy - Hemoglobin A1c  Papanicolaou smear - Cervicovaginal ancillary only - Cytology - PAP [place] - HIV Antibody IF pap is normal, will not need another pap for three years Pain, dental - Ambulatory referral to Dentistry  Intertrigo - nystatin (MYCOSTATIN/NYSTOP) 100000 UNIT/GM POWD; May apply twice daily   Patient may RTC in one year for annual physical.        Lance Bosch, [facility] [phone] 08/27/2013, 8:32 PM

## 2013-08-26 NOTE — Patient Instructions (Addendum)
DASH Diet The DASH diet stands for "Dietary Approaches to Stop Hypertension." It is a healthy eating plan that has been shown to reduce high blood pressure (hypertension) in as little as 14 days, while also possibly providing other significant health benefits. These other health benefits include reducing the risk of breast cancer after menopause and reducing the risk of type 2 diabetes, heart disease, colon cancer, and stroke. Health benefits also include weight loss and slowing kidney failure in patients with chronic kidney disease.  DIET GUIDELINES  Limit salt (sodium). Your diet should contain less than 1500 mg of sodium daily.  Limit refined or processed carbohydrates. Your diet should include mostly whole grains. Desserts and added sugars should be used sparingly.  Include small amounts of heart-healthy fats. These types of fats include nuts, oils, and tub margarine. Limit saturated and trans fats. These fats have been shown to be harmful in the body. CHOOSING FOODS  The following food groups are based on a 2000 calorie diet. See your Registered Dietitian for individual calorie needs. Grains and Grain Products (6 to 8 servings daily)  Eat More Often: Whole-wheat bread, brown Wynia, whole-grain or wheat pasta, quinoa, popcorn without added fat or salt (air popped).  Eat Less Often: White bread, white pasta, white Goul[place], cornbread. Vegetables (4 to 5 servings daily)  Eat More Often: Fresh, frozen, and canned vegetables. Vegetables may be raw, steamed, roasted, or grilled with a minimal amount of fat.  Eat Less Often/Avoid: Creamed or fried vegetables. Vegetables in a cheese sauce. Fruit (4 to 5 servings daily)  Eat More Often: All fresh, canned (in natural juice), or frozen fruits. Dried fruits without added sugar. One hundred percent fruit juice ( cup [237 mL] daily).  Eat Less Often: Dried fruits with added sugar. Canned fruit in light or heavy syrup. [employer], Fish, and Poultry (2  servings or less daily. One serving is 3 to 4 oz [85-114 g]).  Eat More Often: Ninety percent or leaner ground beef, tenderloin, sirloin. Round cuts of beef, chicken breast, [place] breast. All fish. Grill, bake, or broil your meat. Nothing should be fried.  Eat Less Often/Avoid: Fatty cuts of meat, [place], or chicken leg, thigh, or wing. Fried cuts of meat or fish. Dairy (2 to 3 servings)  Eat More Often: Low-fat or fat-free milk, low-fat plain or light yogurt, reduced-fat or part-skim cheese.  Eat Less Often/Avoid: Milk (whole, 2%).Whole milk yogurt. Full-fat cheeses. Nuts, Seeds, and Legumes (4 to 5 servings per week)  Eat More Often: All without added salt.  Eat Less Often/Avoid: Salted nuts and seeds, canned beans with added salt. Fats and Sweets (limited)  Eat More Often: Vegetable oils, tub margarines without trans fats, sugar-free gelatin. Mayonnaise and salad dressings.  Eat Less Often/Avoid: Coconut oils, palm oils, butter, stick margarine, cream, half and half, cookies, candy, pie. FOR MORE INFORMATION The Dash Diet Eating Plan: www.dashdiet.org Document Released: 03/08/2011 Document Revised: 06/11/2011 Document Reviewed: 03/08/2011 [facility] Patient Information 2014 [place], [place]. Exercise to Lose Weight Exercise and a healthy diet may help you lose weight. Your doctor may suggest specific exercises. EXERCISE IDEAS AND TIPS  Choose low-cost things you enjoy doing, such as walking, bicycling, or exercising to workout videos.  Take stairs instead of the elevator.  Walk during your lunch break.  Park your car further away from work or school.  Go to a gym or an exercise class.  Start with 5 to 10 minutes of exercise each day. Build up to 30 minutes  of exercise 4 to 6 days a week.  Wear shoes with good support and comfortable clothes.  Stretch before and after working out.  Work out until you breathe harder and your heart beats faster.  Drink extra water when  you exercise.  Do not do so much that you hurt yourself, feel dizzy, or get very short of breath. Exercises that burn about 150 calories:  Running 1  miles in 15 minutes.  Playing volleyball for 45 to 60 minutes.  Washing and waxing a car for 45 to 60 minutes.  Playing touch football for 45 minutes.  Walking 1  miles in 35 minutes.  Pushing a stroller 1  miles in 30 minutes.  Playing basketball for 30 minutes.  Raking leaves for 30 minutes.  Bicycling 5 miles in 30 minutes.  Walking 2 miles in 30 minutes.  Dancing for 30 minutes.  Shoveling snow for 15 minutes.  Swimming laps for 20 minutes.  Walking up stairs for 15 minutes.  Bicycling 4 miles in 15 minutes.  Gardening for 30 to 45 minutes.  Jumping rope for 15 minutes.  Washing windows or floors for 45 to 60 minutes. Document Released: 04/21/2010 Document Revised: 06/11/2011 Document Reviewed: 04/21/2010 [facility] Patient Information 2014 [place], [place].

## 2013-08-26 NOTE — Progress Notes (Deleted)
Patient ID: Michaela Carrillo, female   DOB: 1986/01/18, 28 y.o.   MRN: [id number]   Michaela Carrillo, is a 28 y.o. female  FUX:[id number]  GUR:[id number]  DOB - 1986/02/04  CC:  Chief Complaint  Patient presents with  . Establish Care       HPI: Michaela Carrillo is a 28 y.o. female here today to establish medical care. Patient has No headache, No chest pain, No abdominal pain - No Nausea, No new weakness tingling or numbness, No Cough - SOB.  Allergies  Allergen Reactions  . Codeine Itching   Past Medical History  Diagnosis Date  . Abnormal Pap smear 2012  . Preeclampsia    Current Outpatient Prescriptions on File Prior to Visit  Medication Sig Dispense Refill  . HYDROcodone-acetaminophen (NORCO/VICODIN) 5-325 MG per tablet Take 1 tablet by mouth every 6 (six) hours as needed.  11 tablet  0  . penicillin v potassium (VEETID) 500 MG tablet Take 1 tablet (500 mg total) by mouth 4 (four) times daily.  40 tablet  0  . naproxen (NAPROSYN) 500 MG tablet Take 1 tablet (500 mg total) by mouth 2 (two) times daily.  30 tablet  0   No current facility-administered medications on file prior to visit.   Family History  Problem Relation Age of Onset  . Hypertension Mother   . Diabetes Mother   . Hypertension Father    History   Social History  . Marital Status: Single    Spouse Name: N/A    Number of Children: N/A  . Years of Education: N/A   Occupational History  . Not on file.   Social History Main Topics  . Smoking status: Never Smoker   . Smokeless tobacco: Never Used  . Alcohol Use: No  . Drug Use: No  . Sexual Activity: Not Currently    Birth Control/ Protection: None   Other Topics Concern  . Not on file   Social History Narrative  . No narrative on file    Review of Systems: Constitutional: Negative for fever, chills, diaphoresis, activity change, appetite change and fatigue. HENT: Negative for ear pain, nosebleeds, congestion, facial swelling,  rhinorrhea, neck pain, neck stiffness and ear discharge.  Eyes: Negative for pain, discharge, redness, itching and visual disturbance. Respiratory: Negative for cough, choking, chest tightness, shortness of breath, wheezing and stridor.  Cardiovascular: Negative for chest pain, palpitations and leg swelling. Gastrointestinal: Negative for abdominal distention. Genitourinary: Negative for dysuria, urgency, frequency, hematuria, flank pain, decreased urine volume, difficulty urinating and dyspareunia.  Musculoskeletal: Negative for back pain, joint swelling, arthralgia and gait problem. Neurological: Negative for dizziness, tremors, seizures, syncope, facial asymmetry, speech difficulty, weakness, light-headedness, numbness and headaches.  Hematological: Negative for adenopathy. Does not bruise/bleed easily. Psychiatric/Behavioral: Negative for hallucinations, behavioral problems, confusion, dysphoric mood, decreased concentration and agitation.    Objective:  There were no vitals filed for this visit.  Physical Exam: Constitutional: Patient appears well-developed and well-nourished. No distress. HENT: Normocephalic, atraumatic, External right and left ear normal. Oropharynx is clear and moist.  Eyes: Conjunctivae and EOM are normal. PERRLA, no scleral icterus. Neck: Normal ROM. Neck supple. No JVD. No tracheal deviation. No thyromegaly. CVS: RRR, S1/S2 +, no murmurs, no gallops, no carotid bruit.  Pulmonary: Effort and breath sounds normal, no stridor, rhonchi, wheezes, rales.  Abdominal: Soft. BS +, no distension, tenderness, rebound or guarding.  Musculoskeletal: Normal range of motion. No edema and no tenderness.  Lymphadenopathy: No lymphadenopathy noted, cervical, inguinal or axillary  Neuro: Alert. Normal reflexes, muscle tone coordination. No cranial nerve deficit. Skin: Skin is warm and dry. No rash noted. Not diaphoretic. No erythema. No pallor. Psychiatric: Normal mood and affect.  Behavior, judgment, thought content normal.  Lab Results  Component Value Date   WBC 11.5* 08/12/2012   HGB 8.6* 08/12/2012   HCT 29.6* 08/12/2012   MCV 61.3* 08/12/2012   PLT 436* 08/12/2012   Lab Results  Component Value Date   CREATININE 0.73 08/12/2012   BUN 13 08/12/2012   NA 139 08/12/2012   K 3.9 08/12/2012   CL 104 08/12/2012   CO2 24 08/12/2012    No results found for this basename: HGBA1C   Lipid Panel     Component Value Date/Time   CHOL 164 06/30/2009 2057   TRIG 72 06/30/2009 2057   HDL 37* 06/30/2009 2057   CHOLHDL 4.4 Ratio 06/30/2009 2057   VLDL 14 06/30/2009 2057   LDLCALC 113* 06/30/2009 2057       Assessment and plan:   Michaela Carrillo was seen today for establish care.  Diagnoses and associated orders for this visit:  Papanicolaou smear  Encounter to establish care  Pain, dental     Return in about 1 week (around 09/02/2013) for Lab Visit.  The patient was given clear instructions to go to ER or return to medical center if symptoms don't improve, worsen or new problems develop. The patient verbalized understanding. The patient was told to call to get lab results if they haven't heard anything in the next week.     Lance Bosch, [facility] [phone] 08/26/2013, 12:12 PM

## 2013-08-27 LAB — HIV ANTIBODY (ROUTINE TESTING W REFLEX): HIV: NONREACTIVE

## 2013-08-27 LAB — VITAMIN D 25 HYDROXY (VIT D DEFICIENCY, FRACTURES): Vit D, 25-Hydroxy: 17 ng/mL — ABNORMAL LOW (ref 30–89)

## 2013-08-28 ENCOUNTER — Telehealth: Payer: Self-pay

## 2013-08-28 NOTE — Telephone Encounter (Signed)
Pt returning call. Please f/u with pt.  °

## 2013-08-28 NOTE — Telephone Encounter (Signed)
Message copied by Dorothe Pea on Fri Aug 28, 2013  9:54 AM ------      Message from: Chari Manning A      Created: Thu Aug 27, 2013  6:57 PM       Labs are normal. Patient to get OTC Vitamin D and take daily. STD test/HIV/syphillis are all negative.Thanks ------

## 2013-08-28 NOTE — Telephone Encounter (Signed)
Patient not available Left message on voice mail to return our call 

## 2013-08-31 ENCOUNTER — Telehealth: Payer: Self-pay

## 2013-08-31 NOTE — Telephone Encounter (Signed)
Returned patient's phone call Patient is aware of all her lab results

## 2013-09-01 ENCOUNTER — Other Ambulatory Visit: Payer: Self-pay | Admitting: Internal Medicine

## 2013-09-01 DIAGNOSIS — N870 Mild cervical dysplasia: Secondary | ICD-10-CM

## 2013-09-02 ENCOUNTER — Telehealth: Payer: Self-pay | Admitting: Emergency Medicine

## 2013-09-02 NOTE — Telephone Encounter (Signed)
Pt given pap smear results. Informed GYN referral placed.  Also informed pt , I will schedule colpo procedure tomorrow and call with scheduled appointment.

## 2013-09-02 NOTE — Telephone Encounter (Signed)
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 I have sent referral to GYN and she needs a colpo scheduled with family practice. Thanks ------

## 2013-09-04 ENCOUNTER — Telehealth: Payer: Self-pay | Admitting: Emergency Medicine

## 2013-09-04 NOTE — Telephone Encounter (Signed)
Pt given scheduled appt for Colpo procedure 09/10/13 @ 830 am. Pt informed if unable to keep appointment call and reschedule @ [phone]

## 2013-09-10 ENCOUNTER — Encounter: Payer: Self-pay | Admitting: Family Medicine

## 2013-09-10 ENCOUNTER — Ambulatory Visit (INDEPENDENT_AMBULATORY_CARE_PROVIDER_SITE_OTHER): Payer: BC Managed Care – PPO | Admitting: Family Medicine

## 2013-09-10 VITALS — BP 117/68 | HR 69 | Temp 99.1°F | Ht 65.0 in | Wt 270.1 lb

## 2013-09-10 DIAGNOSIS — R87619 Unspecified abnormal cytological findings in specimens from cervix uteri: Secondary | ICD-10-CM

## 2013-09-10 NOTE — Progress Notes (Signed)
Patient ID: Michaela Carrillo, female   DOB: July 15, 1985, 28 y.o.   MRN: [id number] Patient given informed consent, signed copy in the chart.  Placed in lithotomy position. Cervix viewed with speculum and colposcope after application of acetic acid.  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 Asymptomatic.  Colposcopy adequate (entire squamocolumnar junctions seen  in entirety) ?  Yes Acetowhite lesions?No Punctation?Now Mosaicism?  No Abnormal vasculature?  No Biopsies?None ECC?No Complications? No Patient has a very small normal-appearing in the cervical polyp that is barely peaking out of the cost at the 11:00 position.  COMMENTS: Patient was given post procedure instructions.  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 I would repeat Pap smear in one year. I discussed with the patient.

## 2013-09-28 ENCOUNTER — Encounter: Payer: Medicaid Other | Admitting: Internal Medicine

## 2013-09-30 ENCOUNTER — Encounter: Payer: Self-pay | Admitting: Obstetrics & Gynecology

## 2013-10-06 ENCOUNTER — Telehealth: Payer: Self-pay | Admitting: Internal Medicine

## 2013-10-06 NOTE — Telephone Encounter (Signed)
Pt. Would like to know why she is being referred to another Physician if she only needs a prescription for birth control.Marland KitchenMarland KitchenPlease call patient.

## 2013-10-07 ENCOUNTER — Telehealth: Payer: Self-pay | Admitting: *Deleted

## 2013-10-07 ENCOUNTER — Other Ambulatory Visit: Payer: Self-pay | Admitting: Internal Medicine

## 2013-10-07 NOTE — Telephone Encounter (Signed)
Find out which [facility] patient would like. You may brieifly go over some of the options with her. If she already knows which one she wants have her come in for a nurse visit and do pregnancy test. If she is unsure of which option or needs advice please make appointment with me. Thanks

## 2013-10-07 NOTE — Telephone Encounter (Signed)
Patient called stating she would like to be put on birth control now. Patient states when she seen you at the last visit you offered birth control but she declined at this time. Patient states she has changed her mind. She had physical with pap on 08/26/2013. Should I make her an appointment with you?

## 2013-10-07 NOTE — Telephone Encounter (Signed)
Patient would like to start doing Depo injection she has used this method before in the past. I have scheduled her for tomorrow (10/08/2013) at 4:45 PM. Can I have an order for this please?

## 2013-10-08 ENCOUNTER — Other Ambulatory Visit: Payer: Self-pay | Admitting: Internal Medicine

## 2013-10-08 ENCOUNTER — Ambulatory Visit: Payer: BC Managed Care – PPO | Attending: Internal Medicine

## 2013-10-08 VITALS — BP 103/59 | HR 61 | Resp 14

## 2013-10-08 DIAGNOSIS — Z3042 Encounter for surveillance of injectable contraceptive: Secondary | ICD-10-CM

## 2013-10-08 DIAGNOSIS — Z30011 Encounter for initial prescription of contraceptive pills: Secondary | ICD-10-CM

## 2013-10-08 DIAGNOSIS — D508 Other iron deficiency anemias: Secondary | ICD-10-CM

## 2013-10-08 LAB — CBC WITH DIFFERENTIAL/PLATELET
BASOS PCT: 0 % (ref 0–1)
Basophils Absolute: 0 10*3/uL (ref 0.0–0.1)
Eosinophils Absolute: 0.2 10*3/uL (ref 0.0–0.7)
Eosinophils Relative: 2 % (ref 0–5)
HCT: 27.6 % — ABNORMAL LOW (ref 36.0–46.0)
HEMOGLOBIN: 8 g/dL — AB (ref 12.0–15.0)
Lymphocytes Relative: 21 % (ref 12–46)
Lymphs Abs: 2.2 10*3/uL (ref 0.7–4.0)
MCH: 17.1 pg — ABNORMAL LOW (ref 26.0–34.0)
MCHC: 29 g/dL — AB (ref 30.0–36.0)
MCV: 59.1 fL — ABNORMAL LOW (ref 78.0–100.0)
MONOS PCT: 6 % (ref 3–12)
Monocytes Absolute: 0.6 10*3/uL (ref 0.1–1.0)
NEUTROS ABS: 7.5 10*3/uL (ref 1.7–7.7)
NEUTROS PCT: 71 % (ref 43–77)
PLATELETS: 394 10*3/uL (ref 150–400)
RBC: 4.67 MIL/uL (ref 3.87–5.11)
RDW: 20.8 % — ABNORMAL HIGH (ref 11.5–15.5)
WBC: 10.5 10*3/uL (ref 4.0–10.5)

## 2013-10-08 LAB — POCT URINE PREGNANCY: PREG TEST UR: NEGATIVE

## 2013-10-08 MED ORDER — NORETHIN ACE-ETH ESTRAD-FE 1-20 MG-MCG PO TABS: 1.0000 | ORAL_TABLET | Freq: Every day | ORAL | Status: DC

## 2013-10-08 NOTE — Progress Notes (Unsigned)
Patient here today for Depo Injection however patient has changed her mind and would like to take pills instead. Patient states the depo injection has caused her to gain weight in the past. Patient counseled about using a back up method for 2 weeks after starting pills. Educated patient about using a back up method if she ever on antibiotics. Also explained to patient about her Hgb A1C being 6.3 and that she is considered pre diabetic. Explained to patient the importance of diet and exercise. Also informed patient she can not be sedentary or she will be at risk for blood clots. Patient verbalized understanding. Roney Jaffe, NP notified and placed order for OCP for patient. Patient will return 11/09/2013 for HTN check and Hgb A1C check. Per Roney Jaffe, NP patient to have CBC done for anemia since her last CBC 07/2012 showed anemia.

## 2013-10-08 NOTE — Patient Instructions (Signed)
Oral Contraception Use  Oral contraceptive pills (OCPs) are medicines taken to prevent pregnancy. OCPs work by preventing the ovaries from releasing eggs. The hormones in OCPs also cause the cervical mucus to thicken, preventing the sperm from entering the uterus. The hormones also cause the uterine lining to become thin, not allowing a fertilized egg to attach to the inside of the uterus. OCPs are highly effective when taken exactly as prescribed. However, OCPs do not prevent sexually transmitted diseases (STDs). Safe sex practices, such as using condoms along with an OCP, can help prevent STDs.  Before taking OCPs, you may have a physical exam and Pap test. Your health care provider may also order blood tests if necessary. Your health care provider will make sure you are a good candidate for oral contraception. Discuss with your health care provider the possible side effects of the OCP you may be prescribed. When starting an OCP, it can take 2 to 3 months for the body to adjust to the changes in hormone levels in your body.   HOW TO TAKE ORAL CONTRACEPTIVE PILLS  Your health care provider may advise you on how to start taking the first cycle of OCPs. Otherwise, you can:   · Start on day 1 of your menstrual period. You will not need any backup contraceptive protection with this start time.    · Start on the first Sunday after your menstrual period or the day you get your prescription. In these cases, you will need to use backup contraceptive protection for the first week.    · Start the pill at any time of your cycle. If you take the pill within 5 days of the start of your period, you are protected against pregnancy right away. In this case, you will not need a backup form of birth control. If you start at any other time of your menstrual cycle, you will need to use another form of birth control for 7 days. If your OCP is the type called a minipill, it will protect you from pregnancy after taking it for 2 days (48  hours).  After you have started taking OCPs:   · If you forget to take 1 pill, take it as soon as you remember. Take the next pill at the regular time.    · If you miss 2 or more pills, call your health care provider because different pills have different instructions for missed doses. Use backup birth control until your next menstrual period starts.    · If you use a 28-day pack that contains inactive pills and you miss 1 of the last 7 pills (pills with no hormones), it will not matter. Throw away the rest of the nonhormone pills and start a new pill pack.    No matter which day you start the OCP, you will always start a new pack on that same day of the week. Have an extra pack of OCPs and a backup contraceptive method available in case you miss some pills or lose your OCP pack.   HOME CARE INSTRUCTIONS   · Do not smoke.    · Always use a condom to protect against STDs. OCPs do not protect against STDs.    · Use a calendar to mark your menstrual period days.    · Read the information and directions that came with your OCP. Talk to your health care provider if you have questions.    SEEK MEDICAL CARE IF:   · You develop nausea and vomiting.    · You have abnormal vaginal discharge   your hair.   You need treatment for mood swings or depression.   You get dizzy when taking the OCP.   You develop acne from taking the OCP.   You become pregnant.  SEEK IMMEDIATE MEDICAL CARE IF:   You develop chest pain.   You develop shortness of breath.   You have an uncontrolled or severe headache.   You develop numbness or slurred speech.   You develop visual problems.   You develop pain, redness, and swelling in the legs.  Document Released: 03/08/2011 Document Revised: 11/19/2012 Document Reviewed: 09/07/2012 [facility] Patient Information 2015 [place], [place]. This information is not intended to replace  advice given to you by your health care provider. Make sure you discuss any questions you have with your health care provider. Oral Contraception Information Oral contraceptive pills (OCPs) are medicines taken to prevent pregnancy. OCPs work by preventing the ovaries from releasing eggs. The hormones in OCPs also cause the cervical mucus to thicken, preventing the sperm from entering the uterus. The hormones also cause the uterine lining to become thin, not allowing a fertilized egg to attach to the inside of the uterus. OCPs are highly effective when taken exactly as prescribed. However, OCPs do not prevent sexually transmitted diseases (STDs). Safe sex practices, such as using condoms along with the pill, can help prevent STDs.  Before taking the pill, you may have a physical exam and Pap test. Your health care provider may order blood tests. The health care provider will make sure you are a good candidate for oral contraception. Discuss with your health care provider the possible side effects of the OCP you may be prescribed. When starting an OCP, it can take 2 to 3 months for the body to adjust to the changes in hormone levels in your body.  TYPES OF ORAL CONTRACEPTION  The combination pill--This pill contains estrogen and progestin (synthetic progesterone) hormones. The combination pill comes in 21-day, 28-day, or 91-day packs. Some types of combination pills are meant to be taken continuously (365-day pills). With 21-day packs, you do not take pills for 7 days after the last pill. With 28-day packs, the pill is taken every day. The last 7 pills are without hormones. Certain types of pills have more than 21 hormone-containing pills. With 91-day packs, the first 84 pills contain both hormones, and the last 7 pills contain no hormones or contain estrogen only.  The minipill--This pill contains the progesterone hormone only. The pill is taken every day continuously. It is very important to take the pill at  the same time each day. The minipill comes in packs of 28 pills. All 28 pills contain the hormone.  ADVANTAGES OF ORAL CONTRACEPTIVE PILLS  Decreases premenstrual symptoms.   Treats menstrual period cramps.   Regulates the menstrual cycle.   Decreases a heavy menstrual flow.   May treatacne, depending on the type of pill.   Treats abnormal uterine bleeding.   Treats polycystic ovarian syndrome.   Treats endometriosis.   Can be used as emergency contraception.  THINGS THAT CAN MAKE ORAL CONTRACEPTIVE PILLS LESS EFFECTIVE OCPs can be less effective if:   You forget to take the pill at the same time every day.   You have a stomach or intestinal disease that lessens the absorption of the pill.   You take OCPs with other medicines that make OCPs less effective, such as antibiotics, certain HIV medicines, and some seizure medicines.   You take expired OCPs.   You forget to  restart the pill on day 7, when using the packs of 21 pills.  RISKS ASSOCIATED WITH ORAL CONTRACEPTIVE PILLS  Oral contraceptive pills can sometimes cause side effects, such as:  Headache.  Nausea.  Breast tenderness.  Irregular bleeding or spotting. Combination pills are also associated with a small increased risk of:  Blood clots.  Heart attack.  Stroke. Document Released: 06/09/2002 Document Revised: 01/07/2013 Document Reviewed: 09/07/2012 [facility] Patient Information 2015 [place], [place]. This information is not intended to replace advice given to you by your health care provider. Make sure you discuss any questions you have with your health care provider.

## 2013-10-14 ENCOUNTER — Other Ambulatory Visit: Payer: Self-pay | Admitting: Internal Medicine

## 2013-10-14 DIAGNOSIS — D509 Iron deficiency anemia, unspecified: Secondary | ICD-10-CM

## 2013-10-14 MED ORDER — FERROUS SULFATE 325 (65 FE) MG PO TABS: 325.0000 mg | ORAL_TABLET | Freq: Two times a day (BID) | ORAL | Status: DC

## 2013-10-15 ENCOUNTER — Telehealth: Payer: Self-pay | Admitting: Internal Medicine

## 2013-10-15 NOTE — Telephone Encounter (Signed)
Pt. Called in stated her face is hurting as well as having headaches....pt. Just had tooth removed, pt. Was advised to call dentist first and then call us if appt. With provider has to be scheduled.

## 2013-10-16 ENCOUNTER — Ambulatory Visit: Payer: BC Managed Care – PPO | Admitting: *Deleted

## 2013-10-16 ENCOUNTER — Telehealth: Payer: Self-pay | Admitting: *Deleted

## 2013-10-16 VITALS — BP 121/89 | HR 88 | Temp 99.0°F | Resp 14

## 2013-10-16 MED ORDER — PREDNISONE 50 MG PO TABS: 50.0000 mg | ORAL_TABLET | Freq: Every day | ORAL | Status: DC

## 2013-10-16 MED ORDER — METHYLPREDNISOLONE SODIUM SUCC 125 MG IJ SOLR
125.0000 mg | Freq: Once | INTRAMUSCULAR | Status: DC
Start: 2013-10-16 — End: 2014-01-12

## 2013-10-16 MED ORDER — CLINDAMYCIN HCL 300 MG PO CAPS: 300.0000 mg | ORAL_CAPSULE | Freq: Three times a day (TID) | ORAL | Status: DC

## 2013-10-16 MED ORDER — DIPHENHYDRAMINE HCL 25 MG PO CAPS: 25.0000 mg | ORAL_CAPSULE | Freq: Four times a day (QID) | ORAL | Status: DC | PRN

## 2013-10-16 NOTE — Telephone Encounter (Signed)
Left message to return my phone call

## 2013-10-16 NOTE — Telephone Encounter (Signed)
Message copied by Joan Mayans on Fri Oct 16, 2013 12:44 PM ------      Message from: Chari Manning A      Created: Wed Oct 14, 2013  9:31 AM       Patient is anemia.  Please advise patient that I have sent a Rx of iron to our pharmacy for her to take twice daily with a meal.  Let patient know that she may need a stool softener as well to help with constipation. Thanks ------

## 2013-10-16 NOTE — Patient Instructions (Signed)
Abscessed Tooth An abscessed tooth is an infection around your tooth. It may be caused by holes or damage to the tooth (cavity) or a dental disease. An abscessed tooth causes mild to very bad pain in and around the tooth. See your dentist right away if you have tooth or gum pain. HOME CARE  Take your medicine as told. Finish it even if you start to feel better.  Do not drive after taking pain medicine.  Rinse your mouth (gargle) often with salt water ( teaspoon salt in 8 ounces of warm water).  Do not apply heat to the outside of your face. GET HELP RIGHT AWAY IF:   You have a temperature by mouth above 102 F (38.9 C), not controlled by medicine.  You have chills and a very bad headache.  You have problems breathing or swallowing.  Your mouth will not open.  You develop puffiness (swelling) on the neck or around the eye.  Your pain is not helped by medicine.  Your pain is getting worse instead of better. MAKE SURE YOU:   Understand these instructions.  Will watch your condition.  Will get help right away if you are not doing well or get worse. Document Released: 09/05/2007 Document Revised: 06/11/2011 Document Reviewed: 06/27/2010 [facility] Patient Information 2015 [place], [place]. This information is not intended to replace advice given to you by your health care provider. Make sure you discuss any questions you have with your health care provider. Drug Allergy Allergic reactions to medicines are common. Some allergic reactions are mild. A delayed type of drug allergy that occurs 1 week or more after exposure to a medicine or vaccine is called serum sickness. A life-threatening, sudden (acute) allergic reaction that involves the whole body is called anaphylaxis. CAUSES  "True" drug allergies occur when there is an allergic reaction to a medicine. This is caused by overactivity of the immune system. First, the body becomes sensitized. The immune system is triggered by your  first exposure to the medicine. Following this first exposure, future exposure to the same medicine may be life-threatening. Almost any medicine can cause an allergic reaction. Common ones are:  Penicillin.  Sulfonamides (sulfa drugs).  Local anesthetics.  X-ray dyes that contain iodine. SYMPTOMS  Common symptoms of a minor allergic reaction are:  Swelling around the mouth.  An itchy red rash or hives.  Vomiting or diarrhea. Anaphylaxis can cause swelling of the mouth and throat. This makes it difficult to breathe and swallow. Severe reactions can be fatal within seconds, even after exposure to only a trace amount of the drug that causes the reaction. HOME CARE INSTRUCTIONS   If you are unsure of what caused your reaction, keep a diary of foods and medicines used. Include the symptoms that followed. Avoid anything that causes reactions.  You may want to follow up with an allergy specialist after the reaction has cleared in order to be tested to confirm the allergy. It is important to confirm that your reaction is an allergy, not just a side effect to the medicine. If you have a true allergy to a medicine, this may prevent that medicine and related medicines from being given to you when you are very ill.  If you have hives or a rash:  Take medicines as directed by your caregiver.  You may use an over-the-counter antihistamine (diphenhydramine) as needed.  Apply cold compresses to the skin or take baths in cool water. Avoid hot baths or showers.  If you are severely  allergic:  Continuous observation after a severe reaction may be needed. Hospitalization is often required.  Wear a medical alert bracelet or necklace stating your allergy.  You and your family must learn how to use an anaphylaxis kit or give an epinephrine injection to temporarily treat an emergency allergic reaction. If you have had a severe reaction, always carry your epinephrine injection or anaphylaxis kit with  you. This can be lifesaving if you have a severe reaction.  Do not drive or perform tasks after treatment until the medicines used to treat your reaction have worn off, or until your caregiver says it is okay. SEEK MEDICAL CARE IF:   You think you had an allergic reaction. Symptoms usually start within 30 minutes after exposure.  Symptoms are getting worse rather than better.  You develop new symptoms.  The symptoms that brought you to your caregiver return. SEEK IMMEDIATE MEDICAL CARE IF:   You have swelling of the mouth, difficulty breathing, or wheezing.  You have a tight feeling in your chest or throat.  You develop hives, swelling, or itching all over your body.  You develop severe vomiting or diarrhea.  You feel faint or pass out. This is an emergency. Use your epinephrine injection or anaphylaxis kit as you have been instructed. Call for emergency medical help. Even if you improve after the injection, you need to be examined at a hospital emergency department. MAKE SURE YOU:   Understand these instructions.  Will watch your condition.  Will get help right away if you are not doing well or get worse. Document Released: 03/19/2005 Document Revised: 06/11/2011 Document Reviewed: 08/23/2010 [facility] Patient Information 2015 [place], [place]. This information is not intended to replace advice given to you by your health care provider. Make sure you discuss any questions you have with your health care provider.

## 2013-10-16 NOTE — Telephone Encounter (Signed)
Pt is aware of her lab results.  

## 2013-10-16 NOTE — Telephone Encounter (Signed)
Message copied by Joan Mayans on Fri Oct 16, 2013  3:23 PM ------      Message from: Chari Manning A      Created: Wed Oct 14, 2013  9:31 AM       Patient is anemia.  Please advise patient that I have sent a Rx of iron to our pharmacy for her to take twice daily with a meal.  Let patient know that she may need a stool softener as well to help with constipation. Thanks ------

## 2013-10-16 NOTE — Progress Notes (Unsigned)
Patient presented to walk-in clinic with lower right side facial swelling with a cold sore. Patient had a tooth extraction on Tuesday. Patient states the swelling started on Wednesday. Patient states she took some Amoxicillin and Aleve that she had at home. Patient states she went to the Dentist today and had x-ray done and they prescribed her Tylenol #3 and Augmentin. Dentist informed her the xray only showed sinusitis. Patient denies shortness of breath. Dr Charlies Silvers notified and seen the patient. Verbal orders received for clindamycin, prednisone, benadryl, and ct scan. Patient informed not to take Augmentin to rule out allergic reaction. Patient informed if she worsens to go to the ER. Patient verbalized understanding. Vivia Birmingham, RN

## 2013-10-19 ENCOUNTER — Ambulatory Visit (HOSPITAL_COMMUNITY)
Admission: RE | Admit: 2013-10-19 | Discharge: 2013-10-19 | Disposition: A | Discharge status: Home / Self Care | Payer: BC Managed Care – PPO | Source: Ambulatory Visit | Attending: Internal Medicine | Admitting: Internal Medicine

## 2013-10-19 ENCOUNTER — Other Ambulatory Visit: Payer: Self-pay | Admitting: Emergency Medicine

## 2013-10-19 ENCOUNTER — Ambulatory Visit: Payer: BC Managed Care – PPO | Admitting: *Deleted

## 2013-10-19 ENCOUNTER — Ambulatory Visit (HOSPITAL_COMMUNITY): Admission: RE | Admit: 2013-10-19 | Payer: BC Managed Care – PPO | Source: Ambulatory Visit

## 2013-10-19 ENCOUNTER — Telehealth: Payer: Self-pay | Admitting: Internal Medicine

## 2013-10-19 DIAGNOSIS — R221 Localized swelling, mass and lump, neck: Principal | ICD-10-CM

## 2013-10-19 DIAGNOSIS — R220 Localized swelling, mass and lump, head: Secondary | ICD-10-CM

## 2013-10-19 MED ORDER — PREDNISONE 50 MG PO TABS: 50.0000 mg | ORAL_TABLET | Freq: Every day | ORAL | Status: DC

## 2013-10-19 MED ORDER — ACYCLOVIR 200 MG PO CAPS: 200.0000 mg | ORAL_CAPSULE | Freq: Every day | ORAL | Status: DC

## 2013-10-19 MED ORDER — ACYCLOVIR 5 % EX OINT: 1.0000 "application " | TOPICAL_OINTMENT | Freq: Three times a day (TID) | CUTANEOUS | Status: DC

## 2013-10-19 NOTE — Telephone Encounter (Signed)
Pt. Wants to know her CT results. Please, f/u with Fraser Din

## 2013-10-19 NOTE — Progress Notes (Unsigned)
Patient presents for CT results and for area on right side of lip.  Informed patient of CT results and had Dr. Charlies Silvers to look at patient lip. 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 Verbal order received for Acyclovir ointment. Patient verbalized understanding. Vivia Birmingham, RN

## 2013-10-19 NOTE — Patient Instructions (Signed)
Cold Sore A cold sore (fever blister) is a skin infection caused by the herpes simplex virus (HSV-1). HSV-1 is closely related to the virus that causes gential herpes (HSV-2), but they are not the same even though both viruses can cause oral and genital infections. Cold sores are small, fluid-filled sores inside of the mouth or on the lips, gums, nose, chin, cheeks, or fingers.  The herpes simplex virus can be easily passed (contagious) to other people through close personal contact, such as kissing or sharing personal items. The virus can also spread to other parts of the body, such as the eyes or genitals. Cold sores are contagious until the sores crust over completely. They often heal within 2 weeks.  Once a person is infected, the herpes simplex virus remains permanently in the body. Therefore, there is no cure for cold sores, and they often recur when a person is tired, stressed, sick, or gets too much sun. Additional factors that can cause a recurrence include hormone changes in menstruation or pregnancy, certain drugs, and cold weather.  CAUSES  Cold sores are caused by the herpes simplex virus. The virus is spread from person to person through close contact, such as through kissing, touching the affected area, or sharing personal items such as lip balm, razors, or eating utensils.  SYMPTOMS  The first infection may not cause symptoms. If symptoms develop, the symptoms often go through different stages. Here is how a cold sore develops:   Tingling, itching, or burning is felt 1-2 days before the outbreak.   Fluid-filled blisters appear on the lips, inside the mouth, nose, or on the cheeks.   The blisters start to ooze clear fluid.   The blisters dry up and a yellow crust appears in its place.   The crust falls off.  Symptoms depend on whether it is the initial outbreak or a recurrence. Some other symptoms with the first outbreak may include:   Fever.   Sore throat.   Headache.    Muscle aches.   Swollen neck glands.  DIAGNOSIS  A diagnosis is often made based on your symptoms and looking at the sores. Sometimes, a sore may be swabbed and then examined in the lab to make a final diagnosis. If the sores are not present, blood tests can find the herpes simplex virus.  TREATMENT  There is no cure for cold sores and no vaccine for the herpes simplex virus. Within 2 weeks, most cold sores go away on their own without treatment. Medicines cannot make the infection go away, but medicine can help relieve some of the pain associated with the sores, can work to stop the virus from multiplying, and can also shorten healing time. Medicine may be in the form of creams, gels, pills, or a shot.  HOME CARE INSTRUCTIONS   Only take over-the-counter or prescription medicines for pain, discomfort, or fever as directed by your caregiver. Do not use aspirin.   Use a cotton-tip swab to apply creams or gels to your sores.   Do not touch the sores or pick the scabs. Wash your hands often. Do not touch your eyes without washing your hands first.   Avoid kissing, oral sex, and sharing personal items until sores heal.   Apply an ice pack on your sores for 10-15 minutes to ease any discomfort.   Avoid hot, cold, or salty foods because they may hurt your mouth. Eat a soft, bland diet to avoid irritating the sores. Use a straw to drink  if you have pain when drinking out of a glass.   Keep sores clean and dry to prevent an infection of other tissues.   Avoid the sun and limit stress if these things trigger outbreaks. If sun causes cold sores, apply sunscreen on the lips before being out in the sun.  SEEK MEDICAL CARE IF:   You have a fever or persistent symptoms for more than 2-3 days.   You have a fever and your symptoms suddenly get worse.   You have pus, not clear fluid, coming from the sores.   You have redness that is spreading.   You have pain or irritation in your  eye.   You get sores on your genitals.   Your sores do not heal within 2 weeks.   You have a weakened immune system.   You have frequent recurrences of cold sores.  MAKE SURE YOU:   Understand these instructions.  Will watch your condition.  Will get help right away if you are not doing well or get worse. Document Released: 03/16/2000 Document Revised: 12/12/2011 Document Reviewed: 08/01/2011 [facility] Patient Information 2015 [place], [place]. This information is not intended to replace advice given to you by your health care provider. Make sure you discuss any questions you have with your health care provider.

## 2013-11-09 ENCOUNTER — Telehealth: Payer: Self-pay | Admitting: Internal Medicine

## 2013-11-09 ENCOUNTER — Ambulatory Visit: Payer: BC Managed Care – PPO | Attending: Internal Medicine | Admitting: *Deleted

## 2013-11-09 VITALS — BP 107/73 | HR 57 | Temp 97.8°F | Resp 16

## 2013-11-09 DIAGNOSIS — R197 Diarrhea, unspecified: Secondary | ICD-10-CM

## 2013-11-09 NOTE — Telephone Encounter (Signed)
Patient States she completed both antibiotics on 10/26/13. States began with soft stools on 11/06/13 LMP 11/06/13 States began with watery stools yesterday. Denies fever, chills, nausea or vomiting. States she has not introduced anything new into her diet. States she did not begin stool softener; only taking Fe every other due  to history of constipation Patient states she has an appt today at 4 pm Instructed to keep today's appt and provider would discuss what to do at that time.

## 2013-11-09 NOTE — Telephone Encounter (Signed)
Pt. Called stating that she has been having diahrreah since Friday

## 2013-11-09 NOTE — Patient Instructions (Signed)

## 2013-11-09 NOTE — Progress Notes (Signed)
Patient was originally scheduled for BP check today. Patient states she has not started her OCP d/t insurance problems. Patient states she has had diarrhea since Thursday night. Patient states she ate some ice cream Thursday night. Patient is having at least 5 loose stools a day. Patient states her stools are clear now. Patient has tried pepto bismal. Patient denies nausea, vomiting, fever or chills. Consulted with Roney Jaffe, NP. Per Roney Jaffe, NP patient advised to take Immodium over the counter. If symptoms persist or worsen after 3-5 days return to clinic. Patient informed of plan of care. Patient verbalized understanding. Vivia Birmingham, RN

## 2013-11-09 NOTE — Telephone Encounter (Signed)
Pt. Called stating that she has been having diarrhea since 11/06/2013. Please f/u with pt.

## 2013-11-16 ENCOUNTER — Encounter: Payer: BC Managed Care – PPO | Admitting: Obstetrics & Gynecology

## 2014-01-12 ENCOUNTER — Encounter (HOSPITAL_COMMUNITY): Payer: Self-pay | Admitting: *Deleted

## 2014-01-12 ENCOUNTER — Inpatient Hospital Stay (HOSPITAL_COMMUNITY)
Admission: AD | Admit: 2014-01-12 | Discharge: 2014-01-12 | Disposition: A | Discharge status: Home / Self Care | Payer: Self-pay | Source: Ambulatory Visit | Attending: Obstetrics & Gynecology | Admitting: Obstetrics & Gynecology

## 2014-01-12 DIAGNOSIS — N949 Unspecified condition associated with female genital organs and menstrual cycle: Secondary | ICD-10-CM | POA: Insufficient documentation

## 2014-01-12 DIAGNOSIS — B373 Candidiasis of vulva and vagina: Secondary | ICD-10-CM | POA: Insufficient documentation

## 2014-01-12 DIAGNOSIS — B3731 Acute candidiasis of vulva and vagina: Secondary | ICD-10-CM

## 2014-01-12 LAB — URINALYSIS, ROUTINE W REFLEX MICROSCOPIC
Bilirubin Urine: NEGATIVE
GLUCOSE, UA: NEGATIVE mg/dL
Hgb urine dipstick: NEGATIVE
KETONES UR: NEGATIVE mg/dL
Leukocytes, UA: NEGATIVE
Nitrite: NEGATIVE
PROTEIN: NEGATIVE mg/dL
Specific Gravity, Urine: 1.015 (ref 1.005–1.030)
UROBILINOGEN UA: 0.2 mg/dL (ref 0.0–1.0)
pH: 6.5 (ref 5.0–8.0)

## 2014-01-12 LAB — WET PREP, GENITAL
Clue Cells Wet Prep HPF POC: NONE SEEN
Trich, Wet Prep: NONE SEEN
WBC, Wet Prep HPF POC: NONE SEEN
Yeast Wet Prep HPF POC: NONE SEEN

## 2014-01-12 LAB — POCT PREGNANCY, URINE: PREG TEST UR: NEGATIVE

## 2014-01-12 LAB — GLUCOSE, CAPILLARY: GLUCOSE-CAPILLARY: 93 mg/dL (ref 70–99)

## 2014-01-12 MED ORDER — FLUCONAZOLE 150 MG PO TABS: 150.0000 mg | ORAL_TABLET | Freq: Once | ORAL | Status: DC

## 2014-01-12 NOTE — Discharge Instructions (Signed)

## 2014-01-12 NOTE — MAU Note (Signed)
Patient states she has been having vaginal pain, itching and discharge for about 2 weeks. Took OTC medication for yeast but had not gotten any better. States she has had pain down the left leg since yesterday, big toe on right foot hurts.

## 2014-01-12 NOTE — MAU Note (Signed)
Pt states she has had pain in her vaginal pain, Pt states she had discharge for about 3wks

## 2014-01-12 NOTE — MAU Provider Note (Signed)
CC: Leg Pain and Vaginal Pain    First Provider Initiated Contact with Patient 01/12/14 1950      HPI Michaela Carrillo is a 28 y.o. [phone] who presents with onset of a week ago of increased white vaginal discharge associated with vaginal discomfort and with itching at times. Used Monistat 12/29/13. LMP 12/30/13. More menstrual cramping than usual with that period, but no pain now.  ROS GYN: Last intercourse 1 month ago, but does not intend to be sexually active and not interested in contraception. Denies abnormal bleeding. States diabetes testing was borderline last May. M-S: Several months of right toe pain without antecedent injury, no skin changes or swelling    Past Medical History  Diagnosis Date  . Abnormal Pap smear 2012  . Preeclampsia     OB History  Gravida Para Term Preterm AB SAB TAB Ectopic Multiple Living  2 1 0 1 1 0 0 0 0 1     # Outcome Date GA Lbr Len/2nd Weight Sex Delivery Anes PTL Lv  2 PRE    2.183 kg (4 lb 13 oz)          Comments: preeclampsia  1 ABT               Past Surgical History  Procedure Laterality Date  . Cesarean section  2005  . Colposcopy  2012    History   Social History  . Marital Status: Single    Spouse Name: N/A    Number of Children: N/A  . Years of Education: N/A   Occupational History  . Not on file.   Social History Main Topics  . Smoking status: Never Smoker   . Smokeless tobacco: Never Used  . Alcohol Use: No  . Drug Use: No  . Sexual Activity: Not Currently    Birth Control/ Protection: None   Other Topics Concern  . Not on file   Social History Narrative  . No narrative on file    No current facility-administered medications on file prior to encounter.   No current outpatient prescriptions on file prior to encounter.    Allergies  Allergen Reactions  . Codeine Itching    PHYSICAL EXAM Filed Vitals:   01/12/14 1840  BP: 113/59  Pulse: 70  Temp: 98.7 F (37.1 C)  Resp: 16   General:  Obese female in no acute distress Cardiovascular: Normal rate Respiratory: Normal effort Abdomen: Soft, nontender Back: No CVAT Extremities: No edema Neurologic: Alert and oriented Speculum exam: NEFG; vagina with adherent white discharge, no blood; cervix clean Bimanual exam: cervix closed, no CMT; unable to outline uterus ; no adnexal tenderness, no masses noted   LAB RESULTS Results for orders placed during the hospital encounter of 01/12/14 (from the past 24 hour(s))  URINALYSIS, ROUTINE W REFLEX MICROSCOPIC     Status: Abnormal   Collection Time    01/12/14  6:45 PM      Result Value Ref Range   Color, Urine YELLOW  YELLOW   APPearance HAZY (*) CLEAR   Specific Gravity, Urine 1.015  1.005 - 1.030   pH 6.5  5.0 - 8.0   Glucose, UA NEGATIVE  NEGATIVE mg/dL   Hgb urine dipstick NEGATIVE  NEGATIVE   Bilirubin Urine NEGATIVE  NEGATIVE   Ketones, ur NEGATIVE  NEGATIVE mg/dL   Protein, ur NEGATIVE  NEGATIVE mg/dL   Urobilinogen, UA 0.2  0.0 - 1.0 mg/dL   Nitrite NEGATIVE  NEGATIVE   Leukocytes, UA NEGATIVE  NEGATIVE  POCT PREGNANCY, URINE     Status: None   Collection Time    01/12/14  6:46 PM      Result Value Ref Range   Preg Test, Ur NEGATIVE  NEGATIVE  WET PREP, GENITAL     Status: None   Collection Time    01/12/14  7:40 PM      Result Value Ref Range   Yeast Wet Prep HPF POC NONE SEEN  NONE SEEN   Trich, Wet Prep NONE SEEN  NONE SEEN   Clue Cells Wet Prep HPF POC NONE SEEN  NONE SEEN   WBC, Wet Prep HPF POC NONE SEEN  NONE SEEN  GLUCOSE, CAPILLARY     Status: None   Collection Time    01/12/14  8:32 PM      Result Value Ref Range   Glucose-Capillary 93  70 - 99 mg/dL    IMAGING No results found.  MAU COURSE GC/CT, HIV sent  ASSESSMENT  1. Yeast vaginitis   Will treat presumptively since symptomatic and has used Monistat  PLAN Discharge home. See AVS for patient education.    Medication List         fluconazole 150 MG tablet  Commonly known as:   DIFLUCAN  Take 1 tablet (150 mg total) by mouth once.     ibuprofen 200 MG tablet  Commonly known as:  ADVIL,MOTRIN  Take 600 mg by mouth every 6 (six) hours as needed for mild pain.       Follow-up Information   Schedule an appointment as soon as possible for a visit with Chari Manning, NP.   Specialty:  Internal Medicine   Contact information:   [place] [address] [phone]        Lorene Dy, CNM 01/12/2014 7:51 PM

## 2014-01-13 LAB — GC/CHLAMYDIA PROBE AMP
CT Probe RNA: NEGATIVE
GC PROBE AMP APTIMA: NEGATIVE

## 2014-01-13 LAB — HIV ANTIBODY (ROUTINE TESTING W REFLEX): HIV 1&2 Ab, 4th Generation: NONREACTIVE

## 2014-01-13 NOTE — MAU Provider Note (Signed)
Attestation of Attending Supervision of Advanced Practitioner (CNM/NP): Evaluation and management procedures were performed by the Advanced Practitioner under my supervision and collaboration.  I have reviewed the Advanced Practitioner's note and chart, and I agree with the management and plan.  Tegh Franek 01/13/2014 1:29 AM

## 2014-01-15 ENCOUNTER — Other Ambulatory Visit: Payer: Self-pay

## 2014-02-01 ENCOUNTER — Encounter (HOSPITAL_COMMUNITY): Payer: Self-pay | Admitting: *Deleted

## 2014-03-18 ENCOUNTER — Ambulatory Visit: Payer: Self-pay | Attending: Internal Medicine

## 2014-03-18 DIAGNOSIS — Z0000 Encounter for general adult medical examination without abnormal findings: Secondary | ICD-10-CM

## 2014-03-18 LAB — CBC
HCT: 30.5 % — ABNORMAL LOW (ref 36.0–46.0)
Hemoglobin: 8.6 g/dL — ABNORMAL LOW (ref 12.0–15.0)
MCH: 17.7 pg — ABNORMAL LOW (ref 26.0–34.0)
MCHC: 28.2 g/dL — ABNORMAL LOW (ref 30.0–36.0)
MCV: 62.8 fL — ABNORMAL LOW (ref 78.0–100.0)
PLATELETS: 414 10*3/uL — AB (ref 150–400)
RBC: 4.86 MIL/uL (ref 3.87–5.11)
RDW: 20 % — ABNORMAL HIGH (ref 11.5–15.5)
WBC: 12 10*3/uL — AB (ref 4.0–10.5)

## 2014-03-24 ENCOUNTER — Telehealth: Payer: Self-pay | Admitting: *Deleted

## 2014-03-24 MED ORDER — FERROUS SULFATE 325 (65 FE) MG PO TABS: 325.0000 mg | ORAL_TABLET | Freq: Three times a day (TID) | ORAL | Status: DC

## 2014-03-24 NOTE — Telephone Encounter (Signed)
-----   Message from Lance Bosch, NP sent at 03/23/2014  1:37 PM EST ----- She is really anemic.  Please send Rx of ferrous sulfate 325 mg to take TID.  Will recheck her in 6 weeks, please make sure she makes a appointment soon to f/u on DM. Is she having heavy cycles?

## 2014-03-24 NOTE — Telephone Encounter (Signed)
Pt aware of lab results. Advised to make appointment to f/u DM Stated has very heavy menses. Rx ferous sulfate 325 mg was e-scribe to [facility]

## 2014-04-21 ENCOUNTER — Ambulatory Visit: Payer: Self-pay | Admitting: Internal Medicine

## 2014-04-28 ENCOUNTER — Ambulatory Visit: Payer: Self-pay | Attending: Internal Medicine | Admitting: Internal Medicine

## 2014-04-28 ENCOUNTER — Encounter: Payer: Self-pay | Admitting: Internal Medicine

## 2014-04-28 ENCOUNTER — Other Ambulatory Visit (HOSPITAL_COMMUNITY): Admission: RE | Admit: 2014-04-28 | Payer: Medicaid Other | Source: Ambulatory Visit

## 2014-04-28 VITALS — BP 106/69 | HR 98 | Temp 98.3°F | Resp 16 | Ht 64.0 in | Wt 273.0 lb

## 2014-04-28 DIAGNOSIS — N898 Other specified noninflammatory disorders of vagina: Secondary | ICD-10-CM | POA: Insufficient documentation

## 2014-04-28 DIAGNOSIS — D649 Anemia, unspecified: Secondary | ICD-10-CM | POA: Insufficient documentation

## 2014-04-28 DIAGNOSIS — Z79899 Other long term (current) drug therapy: Secondary | ICD-10-CM | POA: Insufficient documentation

## 2014-04-28 DIAGNOSIS — R7303 Prediabetes: Secondary | ICD-10-CM

## 2014-04-28 DIAGNOSIS — R7309 Other abnormal glucose: Secondary | ICD-10-CM | POA: Insufficient documentation

## 2014-04-28 LAB — POCT URINALYSIS DIPSTICK
BILIRUBIN UA: NEGATIVE
Blood, UA: NEGATIVE
GLUCOSE UA: NEGATIVE
Ketones, UA: NEGATIVE
Nitrite, UA: NEGATIVE
PH UA: 5.5
PROTEIN UA: NEGATIVE
Spec Grav, UA: 1.025
Urobilinogen, UA: 1

## 2014-04-28 LAB — GLUCOSE, POCT (MANUAL RESULT ENTRY): POC GLUCOSE: 75 mg/dL (ref 70–99)

## 2014-04-28 LAB — POCT GLYCOSYLATED HEMOGLOBIN (HGB A1C): HEMOGLOBIN A1C: 5

## 2014-04-28 NOTE — Patient Instructions (Signed)
Ingrown Toenail An ingrown toenail occurs when the sharp edge of your toenail grows into the skin. Causes of ingrown toenails include toenails clipped too far back or poorly fitting shoes. Activities involving sudden stops (basketball, tennis) causing "toe jamming" may lead to an ingrown nail. HOME CARE INSTRUCTIONS   Soak the whole foot in warm soapy water for 20 minutes, 3 times per day.  You may lift the edge of the nail away from the sore skin by wedging a small piece of cotton under the corner of the nail. Be careful not to dig (traumatize) and cause more injury to the area.  Wear shoes that fit well. While the ingrown nail is causing problems, sandals may be beneficial.  Trim your toenails regularly and carefully. Cut your toenails straight across, not in a curve. This will prevent injury to the skin at the corners of the toenail.  Keep your feet clean and dry.  Crutches may be helpful early in treatment if walking is painful.  Antibiotics, if prescribed, should be taken as directed.  Return for a wound check in 2 days or as directed.  Only take over-the-counter or prescription medicines for pain, discomfort, or fever as directed by your caregiver. SEEK IMMEDIATE MEDICAL CARE IF:   You have a fever.  You have increasing pain, redness, swelling, or heat at the wound site.  Your toe is not better in 7 days. If conservative treatment is not successful, surgical removal of a portion or all of the nail may be necessary. MAKE SURE YOU:   Understand these instructions.  Will watch your condition.  Will get help right away if you are not doing well or get worse. Document Released: 03/16/2000 Document Revised: 06/11/2011 Document Reviewed: 03/10/2008 [employer] Patient Information 2015 [employer]. This information is not intended to replace advice given to you by your health care provider. Make sure you discuss any questions you have with your health care provider.  

## 2014-04-28 NOTE — Progress Notes (Signed)
Patient ID: Michaela Carrillo, female   DOB: 15-Nov-1985, 29 y.o.   MRN: [id number]   CC: anemia, vaginal discharge  HPI: Michaela Carrillo is a 29 y.o. female here today for a follow up visit.  Patient has past medical history of of obesity and anemia.  Patient reports that she has been unable to give plasma because her iron and hemoglobin has been low.  Patient reports that she needs a medical release from her PCP to give plasma.  She c/o of right great toe pain for the past three months. She reports that she has had a xray of the same toe in the past which was negative. Patient reports that pain in located on the side of her nail and becomes red and inflamed. She is concerned it may be a ingrown toenail.  She reports a vaginal discharge for one week. She states that it is white and thin with no odor. She denies itching but reports that she had recent unprotected sex last month.   Patient has No headache, No chest pain, No abdominal pain - No Nausea, No new weakness tingling or numbness, No Cough - SOB.  Allergies  Allergen Reactions  . Codeine Itching   Past Medical History  Diagnosis Date  . Abnormal Pap smear 2012  . Preeclampsia    Current Outpatient Prescriptions on File Prior to Visit  Medication Sig Dispense Refill  . ferrous sulfate 325 (65 FE) MG tablet Take 1 tablet (325 mg total) by mouth 3 (three) times daily with meals. (Patient not taking: Reported on 04/28/2014) 90 tablet 1  . fluconazole (DIFLUCAN) 150 MG tablet Take 1 tablet (150 mg total) by mouth once. (Patient not taking: Reported on 04/28/2014) 2 tablet 0  . ibuprofen (ADVIL,MOTRIN) 200 MG tablet Take 600 mg by mouth every 6 (six) hours as needed for mild pain.     No current facility-administered medications on file prior to visit.   Family History  Problem Relation Age of Onset  . Hypertension Mother   . Diabetes Mother   . Hypertension Father    History   Social History  . Marital Status: Single   Spouse Name: N/A    Number of Children: N/A  . Years of Education: N/A   Occupational History  . Not on file.   Social History Main Topics  . Smoking status: Never Smoker   . Smokeless tobacco: Never Used  . Alcohol Use: No  . Drug Use: No  . Sexual Activity: Not Currently    Birth Control/ Protection: None   Other Topics Concern  . Not on file   Social History Narrative    Review of Systems: See HPI.    Objective:   Filed Vitals:   04/28/14 1621  BP: 106/69  Pulse: 98  Temp: 98.3 F (36.8 C)  Resp: 16    Physical Exam  Constitutional: She is oriented to person, place, and time.  Cardiovascular: Normal rate, regular rhythm and normal heart sounds.   Pulmonary/Chest: Effort normal and breath sounds normal.  Abdominal: Soft. Bowel sounds are normal.  Genitourinary: Vagina normal. No vaginal discharge found.  Musculoskeletal: Normal range of motion.  Neurological: She is alert and oriented to person, place, and time.  Skin: Skin is warm and dry.  Appears to be ingrown toenail of right great toe     Lab Results  Component Value Date   WBC 12.0* 03/18/2014   HGB 8.6* 03/18/2014   HCT 30.5* 03/18/2014   MCV 62.8*  03/18/2014   PLT 414* 03/18/2014   Lab Results  Component Value Date   CREATININE 0.73 08/26/2013   BUN 13 08/26/2013   NA 138 08/26/2013   K 4.4 08/26/2013   CL 103 08/26/2013   CO2 26 08/26/2013    Lab Results  Component Value Date   HGBA1C 5.0 04/28/2014   Lipid Panel     Component Value Date/Time   CHOL 155 08/26/2013 1246   TRIG 70 08/26/2013 1246   HDL 43 08/26/2013 1246   CHOLHDL 3.6 08/26/2013 1246   VLDL 14 08/26/2013 1246   LDLCALC 98 08/26/2013 1246       Assessment and plan:   Faithann was seen today for follow-up.  Diagnoses and associated orders for this visit:  Prediabetes - POCT glycosylated hemoglobin (Hb A1C) - POCT glucose (manual entry)  Morbid obesity Weight loss discussed at length and its  complications to health.  Patient will loss 10 months by next visit in 3 months.  Diet and exercise discussed as well as calorie intake.  Vaginal discharge - POCT urinalysis dipstick - Cervicovaginal ancillary only  Anemia, unspecified anemia type Continue to take iron pills three times per day. Explained that her hemoglobin was extremely low and that medication management is the only way to bring her hemoglobin up. Explained that I cannot write letter for her to give plasma with a hemoglobin that low  Return if symptoms worsen or fail to improve.      Chari Manning, NP-C [facility] [phone] 04/28/2014, 4:53 PM

## 2014-04-28 NOTE — Progress Notes (Signed)
Pt is here today requesting to review her lab results. Pt states that she has occasional pain in her right great toe.

## 2014-04-30 LAB — CERVICOVAGINAL ANCILLARY ONLY
Chlamydia: NEGATIVE
Neisseria Gonorrhea: NEGATIVE
WET PREP (BD AFFIRM): POSITIVE — AB
Wet Prep (BD Affirm): NEGATIVE
Wet Prep (BD Affirm): NEGATIVE

## 2014-05-03 ENCOUNTER — Telehealth: Payer: Self-pay | Admitting: *Deleted

## 2014-05-03 MED ORDER — METRONIDAZOLE 500 MG PO TABS: 500.0000 mg | ORAL_TABLET | Freq: Two times a day (BID) | ORAL | Status: DC

## 2014-05-03 NOTE — Telephone Encounter (Signed)
Rx was send to [facility] Pt aware of results

## 2014-05-03 NOTE — Telephone Encounter (Signed)
-----   Message from Lance Bosch, NP sent at 05/03/2014  1:14 PM EST ----- Patient positive for Bacterial Vaginosis . Please explain this is not a STD, but a imbalance of the vaginal pH. Please send Flagyl 500 mg BID for 7 days. No refills, no alcohol while on this medication.

## 2014-07-01 ENCOUNTER — Telehealth: Payer: Self-pay | Admitting: Internal Medicine

## 2014-07-01 NOTE — Telephone Encounter (Signed)
Pt called requesting document about blood work. Pt will drop off paper work so it can be completed .

## 2014-07-02 ENCOUNTER — Telehealth: Payer: Self-pay | Admitting: Internal Medicine

## 2014-07-02 NOTE — Telephone Encounter (Signed)
Pt called requesting to speak to nurse, pt states she is having yeast infection symptoms.Pt would like an rx. Please f/u with pt

## 2014-07-22 ENCOUNTER — Telehealth: Payer: Self-pay | Admitting: Internal Medicine

## 2014-07-22 NOTE — Telephone Encounter (Signed)
Patient was called to inform her that her paperwork has been completed by the PCP and is ready for pick up at the front desk;

## 2014-07-22 NOTE — Telephone Encounter (Signed)
Pt came into office to pick up form

## 2014-07-23 ENCOUNTER — Emergency Department (INDEPENDENT_AMBULATORY_CARE_PROVIDER_SITE_OTHER)
Admission: EM | Admit: 2014-07-23 | Discharge: 2014-07-23 | Disposition: A | Discharge status: Home / Self Care | Payer: Self-pay | Source: Home / Self Care | Attending: Family Medicine | Admitting: Family Medicine

## 2014-07-23 ENCOUNTER — Encounter (HOSPITAL_COMMUNITY): Payer: Self-pay | Admitting: Emergency Medicine

## 2014-07-23 DIAGNOSIS — J029 Acute pharyngitis, unspecified: Secondary | ICD-10-CM

## 2014-07-23 LAB — POCT RAPID STREP A: STREPTOCOCCUS, GROUP A SCREEN (DIRECT): NEGATIVE

## 2014-07-23 MED ORDER — AMOXICILLIN 500 MG PO CAPS: 500.0000 mg | ORAL_CAPSULE | Freq: Three times a day (TID) | ORAL | Status: DC

## 2014-07-23 NOTE — ED Notes (Signed)
Patient c/o sore throat. Reports she has had chills and feels like she has a fever. Patient is in NAD.

## 2014-07-23 NOTE — ED Provider Notes (Signed)
Michaela Carrillo is a 29 y.o. female who presents to Urgent Care today for sore throat. Over the past week patient has had an itchy scratchy throat runny nose and sneezing. She was doing well until yesterday when she developed worsening sore throat associated with white plaque on her tonsils and subjective fevers. She also notes chills. No vomiting or diarrhea. She's tried tea and DayQuil which helps some. She denies any chest pains or palpitations.   Past Medical History  Diagnosis Date  . Abnormal Pap smear 2012  . Preeclampsia    Past Surgical History  Procedure Laterality Date  . Cesarean section  2005  . Colposcopy  2012   History  Substance Use Topics  . Smoking status: Never Smoker   . Smokeless tobacco: Never Used  . Alcohol Use: No   ROS as above Medications: No current facility-administered medications for this encounter.   Current Outpatient Prescriptions  Medication Sig Dispense Refill  . amoxicillin (AMOXIL) 500 MG capsule Take 1 capsule (500 mg total) by mouth 3 (three) times daily. 21 capsule 0   Allergies  Allergen Reactions  . Codeine Itching     Exam:  BP 109/63 mmHg  Pulse 52  Temp(Src) 98.6 F (37 C) (Oral)  Resp 14  SpO2 100% Gen: Well NAD HEENT: EOMI,  MMM posterior pharynx cobblestoning. Tonsils are erythematous with exudates bilaterally. Cervical lymphadenopathy is present bilaterally. Clear nasal discharge present. Normal tympanic membranes bilaterally. Lungs: Normal work of breathing. CTABL Heart: RRR no MRG Abd: NABS, Soft. Nondistended, Nontender Exts: Brisk capillary refill, warm and well perfused.   Results for orders placed or performed during the hospital encounter of 07/23/14 (from the past 24 hour(s))  POCT rapid strep A [facility])     Status: None   Collection Time: 07/23/14  7:52 PM  Result Value Ref Range   Streptococcus, Group A Screen (Direct) NEGATIVE NEGATIVE   No results found.  Assessment and Plan: 29 y.o.  female with pharyngitis. I suspect patient has allergic pharyngitis with subsequent development of bacterial pharyngitis. Treat with amoxicillin and Rhinocort and Zyrtec. Return to clinic as needed. Throat culture pending.  Discussed warning signs or symptoms. Please see discharge instructions. Patient expresses understanding.     Gregor Hams, MD 07/23/14 2014

## 2014-07-23 NOTE — Discharge Instructions (Signed)
Thank you for coming in today. Call or go to the emergency room if you get worse, have trouble breathing, have chest pains, or palpitations.  Amoxicillin for probable strep throat. Use Tylenol for pain. Take Zyrtec and Rhinocort nasal spray for allergy symptoms. Return as needed.  Pharyngitis Pharyngitis is redness, pain, and swelling (inflammation) of your pharynx.  CAUSES  Pharyngitis is usually caused by infection. Most of the time, these infections are from viruses (viral) and are part of a cold. However, sometimes pharyngitis is caused by bacteria (bacterial). Pharyngitis can also be caused by allergies. Viral pharyngitis may be spread from person to person by coughing, sneezing, and personal items or utensils (cups, forks, spoons, toothbrushes). Bacterial pharyngitis may be spread from person to person by more intimate contact, such as kissing.  SIGNS AND SYMPTOMS  Symptoms of pharyngitis include:   Sore throat.   Tiredness (fatigue).   Low-grade fever.   Headache.  Joint pain and muscle aches.  Skin rashes.  Swollen lymph nodes.  Plaque-like film on throat or tonsils (often seen with bacterial pharyngitis). DIAGNOSIS  Your health care provider will ask you questions about your illness and your symptoms. Your medical history, along with a physical exam, is often all that is needed to diagnose pharyngitis. Sometimes, a rapid strep test is done. Other lab tests may also be done, depending on the suspected cause.  TREATMENT  Viral pharyngitis will usually get better in 3-4 days without the use of medicine. Bacterial pharyngitis is treated with medicines that kill germs (antibiotics).  HOME CARE INSTRUCTIONS   Drink enough water and fluids to keep your urine clear or pale yellow.   Only take over-the-counter or prescription medicines as directed by your health care provider:   If you are prescribed antibiotics, make sure you finish them even if you start to feel better.    Do not take aspirin.   Get lots of rest.   Gargle with 8 oz of salt water ( tsp of salt per 1 qt of water) as often as every 1-2 hours to soothe your throat.   Throat lozenges (if you are not at risk for choking) or sprays may be used to soothe your throat. SEEK MEDICAL CARE IF:   You have large, tender lumps in your neck.  You have a rash.  You cough up green, yellow-brown, or bloody spit. SEEK IMMEDIATE MEDICAL CARE IF:   Your neck becomes stiff.  You drool or are unable to swallow liquids.  You vomit or are unable to keep medicines or liquids down.  You have severe pain that does not go away with the use of recommended medicines.  You have trouble breathing (not caused by a stuffy nose). MAKE SURE YOU:   Understand these instructions.  Will watch your condition.  Will get help right away if you are not doing well or get worse. Document Released: 03/19/2005 Document Revised: 01/07/2013 Document Reviewed: 11/24/2012 [facility] Patient Information 2015 [place], [place]. This information is not intended to replace advice given to you by your health care provider. Make sure you discuss any questions you have with your health care provider.

## 2014-07-26 LAB — CULTURE, GROUP A STREP: STREP A CULTURE: NEGATIVE

## 2014-08-19 ENCOUNTER — Ambulatory Visit: Payer: Self-pay | Attending: Internal Medicine | Admitting: Family Medicine

## 2014-08-19 VITALS — BP 113/74 | HR 70 | Wt 282.5 lb

## 2014-08-19 DIAGNOSIS — N898 Other specified noninflammatory disorders of vagina: Secondary | ICD-10-CM

## 2014-08-19 NOTE — Progress Notes (Signed)
Subjective:     Patient ID: Michaela Carrillo, female   DOB: [birth year], 29 y.o.   MRN: [id number]  HPI   Patient presents today with a c/o of "yeast infection"  For 3 days. She reports a thin, watery discharge with use itching and with external burning with urination. She has a history of previous BV and a history of yeart vaginitis with antibiotic use. She denies every having trich or other sexually transmitted disease. She has no had intercourse in a bout a month and does report using condoms.   Review of Systems   Denies lower abdominal or pelvic pain, Denies thick discolored discharge. Denies fever or chills Objective:   Physical Exam   She is alert, oriented and appropriate, in no distress. There is no abd or pelvic tenderness. There is a thin whitish discharge from the vagina.    Assessment:     Vaginal discharge and irritation    Plan:     Wet prep for Trich, BV and yeast. Will call with results and prescribe appropriate treatment.   Micheline Chapman, FNP-BC

## 2014-08-19 NOTE — Patient Instructions (Signed)
Will call with results of labs and call in the appropriate medication for yeast or bacterial vaginosis In the meantime you can use OTC Vagisil for the itching and burning. Continue to use condoms with intercourse.

## 2014-08-20 ENCOUNTER — Other Ambulatory Visit: Payer: Self-pay

## 2014-08-20 LAB — WET PREP BY MOLECULAR PROBE
Candida species: NEGATIVE
Gardnerella vaginalis: POSITIVE — AB
Trichomonas vaginosis: NEGATIVE

## 2014-08-23 ENCOUNTER — Other Ambulatory Visit: Payer: Self-pay | Admitting: Family Medicine

## 2014-08-23 DIAGNOSIS — N760 Acute vaginitis: Principal | ICD-10-CM

## 2014-08-23 DIAGNOSIS — B9689 Other specified bacterial agents as the cause of diseases classified elsewhere: Secondary | ICD-10-CM

## 2014-08-23 MED ORDER — METRONIDAZOLE 500 MG PO TABS: 500.0000 mg | ORAL_TABLET | Freq: Two times a day (BID) | ORAL | Status: DC

## 2014-08-25 ENCOUNTER — Telehealth: Payer: Self-pay | Admitting: Internal Medicine

## 2014-08-25 NOTE — Telephone Encounter (Signed)
Patient is calling to request her pap smear results, please f/u

## 2014-08-26 ENCOUNTER — Other Ambulatory Visit: Payer: Self-pay | Admitting: *Deleted

## 2014-12-13 ENCOUNTER — Encounter: Payer: Self-pay | Admitting: Internal Medicine

## 2014-12-13 ENCOUNTER — Ambulatory Visit: Payer: Self-pay | Attending: Internal Medicine | Admitting: Internal Medicine

## 2014-12-13 VITALS — BP 96/63 | HR 52 | Temp 98.5°F | Resp 18 | Ht 64.0 in | Wt 287.0 lb

## 2014-12-13 DIAGNOSIS — Z30013 Encounter for initial prescription of injectable contraceptive: Secondary | ICD-10-CM | POA: Insufficient documentation

## 2014-12-13 LAB — POCT URINE PREGNANCY: Preg Test, Ur: NEGATIVE

## 2014-12-13 MED ORDER — MEDROXYPROGESTERONE ACETATE 150 MG/ML IM SUSP
150.0000 mg | Freq: Once | INTRAMUSCULAR | Status: AC
  Administered 2014-12-13: 150 mg via INTRAMUSCULAR

## 2014-12-13 NOTE — Patient Instructions (Signed)

## 2014-12-13 NOTE — Progress Notes (Signed)
Pt here for birth control. Has question about INJ Vs OBC  No vaginal problems  No hx tobacco

## 2014-12-14 NOTE — Progress Notes (Signed)
Patient ID: Michaela Carrillo, female   DOB: [birth year], 29 y.o.   MRN: [id number]  CC: Birth control  HPI: Michaela Carrillo is a 29 y.o. female here today to discuss birth control options.  Patient has past medical history of obesity. Patient reports that she is currently sexually active and desires to be on birth control to prevent pregnancy. Patient has been on oral contraception of peels as well as injections and reports that she did fine with both. Patient reports that she has no prior history of blood clots does have a sedentary lifestyle. Patient is not a smoker.  Allergies  Allergen Reactions  . Codeine Itching   Past Medical History  Diagnosis Date  . Abnormal Pap smear 2012  . Preeclampsia    No current outpatient prescriptions on file prior to visit.   No current facility-administered medications on file prior to visit.   Family History  Problem Relation Age of Onset  . Hypertension Mother   . Diabetes Mother   . Hypertension Father    Social History   Social History  . Marital Status: Single    Spouse Name: N/A  . Number of Children: N/A  . Years of Education: N/A   Occupational History  . Not on file.   Social History Main Topics  . Smoking status: Never Smoker   . Smokeless tobacco: Never Used  . Alcohol Use: No  . Drug Use: No  . Sexual Activity: Not Currently    Birth Control/ Protection: None   Other Topics Concern  . Not on file   Social History Narrative    Review of Systems: Constitutional: Negative for fever, chills, diaphoresis, activity change, appetite change and fatigue. HENT: Negative for ear pain, nosebleeds, congestion, facial swelling, rhinorrhea, neck pain, neck stiffness and ear discharge.  Eyes: Negative for pain, discharge, redness, itching and visual disturbance. Respiratory: Negative for cough, choking, chest tightness, shortness of breath, wheezing and stridor.  Cardiovascular: Negative for chest pain, palpitations and  leg swelling. Gastrointestinal: Negative for abdominal distention. Genitourinary: Negative for dysuria, urgency, frequency, hematuria, flank pain, decreased urine volume, difficulty urinating and dyspareunia.  Musculoskeletal: Negative for back pain, joint swelling, arthralgias and gait problem. Neurological: Negative for dizziness, tremors, seizures, syncope, facial asymmetry, speech difficulty, weakness, light-headedness, numbness and headaches.  Hematological: Negative for adenopathy. Does not bruise/bleed easily. Psychiatric/Behavioral: Negative for hallucinations, behavioral problems, confusion, dysphoric mood, decreased concentration and agitation.    Objective:   Filed Vitals:   12/13/14 1552  BP: 96/63  Pulse: 52  Temp: 98.5 F (36.9 C)  Resp: 18    Physical Exam  Constitutional: She is oriented to person, place, and time.  Obese  Cardiovascular: Normal rate, regular rhythm and normal heart sounds.   Pulmonary/Chest: Effort normal and breath sounds normal.  Neurological: She is alert and oriented to person, place, and time.     Lab Results  Component Value Date   WBC 12.0* 03/18/2014   HGB 8.6* 03/18/2014   HCT 30.5* 03/18/2014   MCV 62.8* 03/18/2014   PLT 414* 03/18/2014   Lab Results  Component Value Date   CREATININE 0.73 08/26/2013   BUN 13 08/26/2013   NA 138 08/26/2013   K 4.4 08/26/2013   CL 103 08/26/2013   CO2 26 08/26/2013    Lab Results  Component Value Date   HGBA1C 5.0 04/28/2014   Lipid Panel     Component Value Date/Time   CHOL 155 08/26/2013 1246   TRIG 70 08/26/2013 1246  HDL 43 08/26/2013 1246   CHOLHDL 3.6 08/26/2013 1246   VLDL 14 08/26/2013 1246   LDLCALC 98 08/26/2013 1246       Assessment and plan:   Michaela Carrillo was seen today for contraception.  Diagnoses and all orders for this visit:  Encounter for initial prescription of injectable contraceptive -     POCT urine pregnancy---negative -     Begin  medroxyPROGESTERone (DEPO-PROVERA) injection 150 mg; Inject 1 mL (150 mg total) into the muscle once.  Return for nov 28-dec 12--RN visit depo inj.        Lance Bosch, [facility] [phone] 12/14/2014, 12:19 PM

## 2015-02-01 ENCOUNTER — Encounter: Payer: Self-pay | Admitting: Family Medicine

## 2015-02-01 ENCOUNTER — Ambulatory Visit: Payer: Self-pay | Attending: Family Medicine | Admitting: Family Medicine

## 2015-02-01 VITALS — BP 109/74 | HR 60 | Temp 98.4°F | Resp 18 | Ht 66.0 in | Wt 282.0 lb

## 2015-02-01 DIAGNOSIS — A084 Viral intestinal infection, unspecified: Secondary | ICD-10-CM | POA: Insufficient documentation

## 2015-02-01 MED ORDER — PROMETHAZINE HCL 25 MG PO TABS: 25.0000 mg | ORAL_TABLET | Freq: Three times a day (TID) | ORAL | Status: DC | PRN

## 2015-02-01 NOTE — Progress Notes (Signed)
CC: Abdominal pain for 1 day.  HPI: Michaela Carrillo is a 29 y.o. female with a history of obesity who presents with 1 day history of abdominal pain after she ate hotdogs at the church picnic yesterday. Admits to nausea but no vomiting or diarrhea or fever. She took some Pepto-Bismol and some Nexium pills with no relief in symptoms.  She would like to be tested for Helicobacter pylori.  Allergies  Allergen Reactions  . Codeine Itching   Past Medical History  Diagnosis Date  . Abnormal Pap smear 2012  . Preeclampsia    No current outpatient prescriptions on file prior to visit.   No current facility-administered medications on file prior to visit.   Family History  Problem Relation Age of Onset  . Hypertension Mother   . Diabetes Mother   . Hypertension Father    Social History   Social History  . Marital Status: Single    Spouse Name: N/A  . Number of Children: N/A  . Years of Education: N/A   Occupational History  . Not on file.   Social History Main Topics  . Smoking status: Never Smoker   . Smokeless tobacco: Never Used  . Alcohol Use: No  . Drug Use: No  . Sexual Activity: Not Currently    Birth Control/ Protection: None   Other Topics Concern  . Not on file   Social History Narrative    Review of Systems: Constitutional: Negative for fever, chills, diaphoresis, activity change, appetite change and fatigue. HENT: Negative for ear pain, nosebleeds, congestion, facial swelling, rhinorrhea, neck pain, neck stiffness and ear discharge.  Eyes: Negative for pain, discharge, redness, itching and visual disturbance. Respiratory: Negative for cough, choking, chest tightness, shortness of breath, wheezing and stridor.  Cardiovascular: Negative for chest pain, palpitations and leg swelling. Gastrointestinal: See history of present illness. Genitourinary: Negative for dysuria, urgency, frequency, hematuria, flank pain, decreased urine volume, difficulty  urinating and dyspareunia.  Musculoskeletal: Negative for back pain, joint swelling, arthralgias and gait problem. Neurological: Negative for dizziness, tremors, seizures, syncope, facial asymmetry, speech difficulty, weakness, light-headedness, numbness and headaches.  Hematological: Negative for adenopathy. Does not bruise/bleed easily. Psychiatric/Behavioral: Negative for hallucinations, behavioral problems, confusion, dysphoric mood, decreased concentration and agitation.    Objective:   Filed Vitals:   02/01/15 1514  BP: 109/74  Pulse: 60  Temp: 98.4 F (36.9 C)  Resp: 18    Physical Exam: Constitutional: Patient appears well-developed and well-nourished. No distress. HENT: Normocephalic, atraumatic, External right and left ear normal. Oropharynx is clear and moist.  Eyes: Conjunctivae and EOM are normal. PERRLA, no scleral icterus. Neck: Normal ROM. Neck supple. No JVD. No tracheal deviation. No thyromegaly. CVS: RRR, S1/S2 +, no murmurs, no gallops, no carotid bruit.  Pulmonary: Effort and breath sounds normal, no stridor, rhonchi, wheezes, rales.  Abdominal: Soft. BS +,  no distension, tenderness, rebound or guarding.  Musculoskeletal: Normal range of motion. No edema and no tenderness.  Lymphadenopathy: No lymphadenopathy noted, cervical, inguinal or axillary Neuro: Alert. Normal reflexes, muscle tone coordination. No cranial nerve deficit. Skin: Skin is warm and dry. No rash noted. Not diaphoretic. No erythema. No pallor. Psychiatric: Normal mood and affect. Behavior, judgment, thought content normal.  Lab Results  Component Value Date   WBC 12.0* 03/18/2014   HGB 8.6* 03/18/2014   HCT 30.5* 03/18/2014   MCV 62.8* 03/18/2014   PLT 414* 03/18/2014   Lab Results  Component Value Date   CREATININE 0.73 08/26/2013  BUN 13 08/26/2013   NA 138 08/26/2013   K 4.4 08/26/2013   CL 103 08/26/2013   CO2 26 08/26/2013    Lab Results  Component Value Date   HGBA1C  5.0 04/28/2014   Lipid Panel     Component Value Date/Time   CHOL 155 08/26/2013 1246   TRIG 70 08/26/2013 1246   HDL 43 08/26/2013 1246   CHOLHDL 3.6 08/26/2013 1246   VLDL 14 08/26/2013 1246   LDLCALC 98 08/26/2013 1246       Assessment and plan:  Viral gastroenteritis: Symptoms in keeping with a prodrome of viral gastroenteritis. I have prescribed an antiemetic and have encouraged her to increase her fluid intake as tolerated. Condition is self-limiting and there is no indication for antibiotic or antispasmodics at this time. Given that she took a dose of Nexium last night she will have to wait at least 2 weeks to be tested for Helicobacter pylori to avoid false negative result and she understands this - she plans to return to see her PCP in 2 weeks for this. Return precautions given.  This note has been created with Surveyor, quantity. Any transcriptional errors are unintentional.         Arnoldo Morale, MD. [facility] [phone] 02/01/2015, 3:46 PM

## 2015-02-01 NOTE — Progress Notes (Signed)
Pt reports stomach pain since last night. Pt took OTC pepto bismol, and her Mother's stomach meds. Pt rates pain 7/10 described pain sharp pain.  Pt reports pain after she eat certain foods.

## 2015-02-01 NOTE — Patient Instructions (Signed)

## 2015-02-17 ENCOUNTER — Ambulatory Visit: Payer: Medicaid Other | Admitting: Internal Medicine

## 2015-03-13 ENCOUNTER — Emergency Department (HOSPITAL_COMMUNITY)
Admission: EM | Admit: 2015-03-13 | Discharge: 2015-03-13 | Disposition: A | Discharge status: Home / Self Care | Payer: Medicaid Other | Attending: Emergency Medicine | Admitting: Emergency Medicine

## 2015-03-13 ENCOUNTER — Emergency Department (HOSPITAL_COMMUNITY): Payer: Medicaid Other

## 2015-03-13 ENCOUNTER — Encounter (HOSPITAL_COMMUNITY): Payer: Self-pay

## 2015-03-13 DIAGNOSIS — S9031XA Contusion of right foot, initial encounter: Secondary | ICD-10-CM | POA: Insufficient documentation

## 2015-03-13 DIAGNOSIS — E669 Obesity, unspecified: Secondary | ICD-10-CM | POA: Insufficient documentation

## 2015-03-13 DIAGNOSIS — Y92219 Unspecified school as the place of occurrence of the external cause: Secondary | ICD-10-CM | POA: Insufficient documentation

## 2015-03-13 DIAGNOSIS — S8001XA Contusion of right knee, initial encounter: Secondary | ICD-10-CM

## 2015-03-13 DIAGNOSIS — Y9389 Activity, other specified: Secondary | ICD-10-CM | POA: Insufficient documentation

## 2015-03-13 DIAGNOSIS — Y998 Other external cause status: Secondary | ICD-10-CM | POA: Insufficient documentation

## 2015-03-13 DIAGNOSIS — W010XXA Fall on same level from slipping, tripping and stumbling without subsequent striking against object, initial encounter: Secondary | ICD-10-CM | POA: Insufficient documentation

## 2015-03-13 MED ORDER — IBUPROFEN 600 MG PO TABS: 600.0000 mg | ORAL_TABLET | Freq: Three times a day (TID) | ORAL | Status: DC | PRN

## 2015-03-13 MED ORDER — IBUPROFEN 200 MG PO TABS
600.0000 mg | ORAL_TABLET | Freq: Once | ORAL | Status: AC
  Administered 2015-03-13: 600 mg via ORAL
  Filled 2015-03-13: qty 3

## 2015-03-13 NOTE — ED Provider Notes (Signed)
CSN: [id number]     Arrival date & time 03/13/15  1012 History   First MD Initiated Contact with Patient 03/13/15 1036     Chief Complaint  Patient presents with  . Fall     (Consider location/radiation/quality/duration/timing/severity/associated sxs/prior Treatment) HPI 29 year old female presents with right foot and right knee pain. She states that she was picking up her daughter from school 3 days ago and she seemed to trip and fall in the parenchyma. She landed on her anterior body. She states her whole-body has been hurting but everything is been gradually improving except that her knee and heel still heard. Initially had some knee swelling and bruising, the swelling is improved but there is still a small bruise. Denies any hip pain. Her left side was also hurting but this is also improved. Her right foot hurts the worst, mostly at her heel. Has not seen any swelling of her ankle. No weakness or numbness. Did not hit her head. Took ibuprofen intermittently for the pain, most recently last night.  Past Medical History  Diagnosis Date  . Abnormal Pap smear 2012  . Preeclampsia    Past Surgical History  Procedure Laterality Date  . Cesarean section  2005  . Colposcopy  2012   Family History  Problem Relation Age of Onset  . Hypertension Mother   . Diabetes Mother   . Hypertension Father    Social History  Substance Use Topics  . Smoking status: Never Smoker   . Smokeless tobacco: Never Used  . Alcohol Use: No   OB History    Gravida Para Term Preterm AB TAB SAB Ectopic Multiple Living   2 1 0 1 1 0 0 0 0 1      Review of Systems  Musculoskeletal: Positive for arthralgias. Negative for joint swelling.  Neurological: Negative for syncope, weakness and numbness.  All other systems reviewed and are negative.     Allergies  Codeine  Home Medications   Prior to Admission medications   Medication Sig Start Date End Date Taking? Authorizing Provider  promethazine  (PHENERGAN) 25 MG tablet Take 1 tablet (25 mg total) by mouth every 8 (eight) hours as needed for nausea or vomiting. 02/01/15   Arnoldo Morale, MD   BP 117/48 mmHg  Pulse 60  Temp(Src) 98.2 F (36.8 C) (Oral)  Resp 18  SpO2 99%  LMP 03/03/2015 (Approximate) Physical Exam  Constitutional: She is oriented to person, place, and time. She appears well-developed and well-nourished. No distress.  obese  HENT:  Head: Normocephalic and atraumatic.  Right Ear: External ear normal.  Left Ear: External ear normal.  Nose: Nose normal.  Eyes: Right eye exhibits no discharge. Left eye exhibits no discharge.  Cardiovascular: Normal rate and intact distal pulses.   Pulses:      Radial pulses are 2+ on the right side, and 2+ on the left side.       Dorsalis pedis pulses are 2+ on the right side, and 2+ on the left side.  Pulmonary/Chest: Effort normal.  Abdominal: She exhibits no distension.  Musculoskeletal:       Right knee: She exhibits ecchymosis and bony tenderness (over patella). She exhibits normal range of motion.       Left knee: She exhibits normal range of motion. No tenderness found.       Right ankle: She exhibits normal range of motion and no swelling. No tenderness.       Left ankle: She exhibits normal range of motion.  No tenderness.       Right upper leg: She exhibits no tenderness.       Right lower leg: She exhibits no tenderness.       Right foot: There is tenderness (over calcaneus). There is no swelling.       Left foot: There is no tenderness.  Neurological: She is alert and oriented to person, place, and time.  Skin: Skin is warm and dry. She is not diaphoretic.  Nursing note and vitals reviewed.   ED Course  Procedures (including critical care time) Labs Review Labs Reviewed - No data to display  Imaging Review Dg Knee Complete 4 Views Right  03/13/2015  CLINICAL DATA:  Trip and fall 4 days ago with right knee pain, initial encounter EXAM: RIGHT KNEE - COMPLETE 4+  VIEW COMPARISON:  None. FINDINGS: There is no evidence of fracture, dislocation, or joint effusion. There is no evidence of arthropathy or other focal bone abnormality. Soft tissues are unremarkable. IMPRESSION: No acute abnormality noted. Electronically Signed   By: Inez Catalina M.D.   On: 03/13/2015 12:06   Dg Foot Complete Right  03/13/2015  CLINICAL DATA:  Trip and fall 3 days ago with persistent right foot pain, initial encounter EXAM: RIGHT FOOT COMPLETE - 3+ VIEW COMPARISON:  None. FINDINGS: There is no evidence of fracture or dislocation. There is no evidence of arthropathy or other focal bone abnormality. Soft tissues are unremarkable. IMPRESSION: No acute abnormality noted. Electronically Signed   By: Inez Catalina M.D.   On: 03/13/2015 12:09   I have personally reviewed and evaluated these images and lab results as part of my medical decision-making.   EKG Interpretation None      MDM   Final diagnoses:  Foot contusion, right, initial encounter  Knee contusion, right, initial encounter    Patient's x-rays are unremarkable. Patient's symptoms are consistent with contusions. She is able to family without difficulty, only mild pain. Recommend continued ibuprofen, ice, and elevation. Follow-up with PCP if not improving.    Sherwood Gambler, MD 03/13/15 [phone]

## 2015-03-13 NOTE — ED Notes (Signed)
Patient transported to X-ray 

## 2015-03-13 NOTE — ED Notes (Signed)
She states she tripped and fell at a school Thurs. And c/o bilat. Foot/ankle pain.  She is in no distress.

## 2015-03-29 ENCOUNTER — Telehealth: Payer: Self-pay | Admitting: Internal Medicine

## 2015-03-29 ENCOUNTER — Telehealth: Payer: Self-pay

## 2015-03-29 NOTE — Telephone Encounter (Signed)
Pt. Called requesting to speak to nurse. Pt. Stated that everything she eats makes her stomach hurt. Please f/u with pt. For advise.

## 2015-03-29 NOTE — Telephone Encounter (Signed)
Pt. Returned call. Pt. Stated if the nurse can call back around 12 noon. Please f/u.

## 2015-03-29 NOTE — Telephone Encounter (Signed)
Returned patient phone call Patient not available  message left on voice mail to return our call

## 2015-05-13 ENCOUNTER — Ambulatory Visit: Payer: Medicaid Other

## 2015-07-05 ENCOUNTER — Emergency Department (HOSPITAL_COMMUNITY)
Admission: EM | Admit: 2015-07-05 | Discharge: 2015-07-05 | Disposition: A | Discharge status: Home / Self Care | Payer: Medicaid Other | Attending: Emergency Medicine | Admitting: Emergency Medicine

## 2015-07-05 DIAGNOSIS — K219 Gastro-esophageal reflux disease without esophagitis: Secondary | ICD-10-CM | POA: Insufficient documentation

## 2015-07-05 DIAGNOSIS — Z3202 Encounter for pregnancy test, result negative: Secondary | ICD-10-CM | POA: Insufficient documentation

## 2015-07-05 DIAGNOSIS — Z79899 Other long term (current) drug therapy: Secondary | ICD-10-CM | POA: Insufficient documentation

## 2015-07-05 LAB — URINALYSIS, ROUTINE W REFLEX MICROSCOPIC
BILIRUBIN URINE: NEGATIVE
Glucose, UA: NEGATIVE mg/dL
HGB URINE DIPSTICK: NEGATIVE
KETONES UR: NEGATIVE mg/dL
Leukocytes, UA: NEGATIVE
Nitrite: NEGATIVE
PROTEIN: NEGATIVE mg/dL
Specific Gravity, Urine: 1.034 — ABNORMAL HIGH (ref 1.005–1.030)
pH: 6 (ref 5.0–8.0)

## 2015-07-05 LAB — PREGNANCY, URINE: PREG TEST UR: NEGATIVE

## 2015-07-05 MED ORDER — GI COCKTAIL ~~LOC~~
30.0000 mL | Freq: Once | ORAL | Status: AC
  Administered 2015-07-05: 30 mL via ORAL
  Filled 2015-07-05: qty 30

## 2015-07-05 MED ORDER — FAMOTIDINE 20 MG PO TABS
40.0000 mg | ORAL_TABLET | Freq: Once | ORAL | Status: AC
  Administered 2015-07-05: 40 mg via ORAL
  Filled 2015-07-05: qty 2

## 2015-07-05 MED ORDER — SUCRALFATE 1 G PO TABS
1.0000 g | ORAL_TABLET | Freq: Once | ORAL | Status: AC
  Administered 2015-07-05: 1 g via ORAL
  Filled 2015-07-05: qty 1

## 2015-07-05 MED ORDER — PANTOPRAZOLE SODIUM 20 MG PO TBEC: 20.0000 mg | DELAYED_RELEASE_TABLET | Freq: Every day | ORAL | Status: DC

## 2015-07-05 MED ORDER — SUCRALFATE 1 G PO TABS: 1.0000 g | ORAL_TABLET | Freq: Four times a day (QID) | ORAL | Status: DC

## 2015-07-05 NOTE — ED Notes (Signed)
MD at bedside. 

## 2015-07-05 NOTE — ED Provider Notes (Signed)
CSN: [id number]     Arrival date & time 07/05/15  0325 History   First MD Initiated Contact with Patient 07/05/15 [phone]     Chief Complaint  Patient presents with  . Abdominal Pain     (Consider location/radiation/quality/duration/timing/severity/associated sxs/prior Treatment) HPI Comments: Patient here complaining of abdominal discomfort times several months that is worse after she eats. Feels like a burning sensation is across her mid epigastrium without radiation to her back. No associated fever, vomiting, diarrhea. These are worse tonight after she had a fatty meal. Used Pepto-Bismol without relief. She also endorses some dysuria without vaginal bleeding or discharge. Denies any flank pain.  Patient is a 30 y.o. female presenting with abdominal pain. The history is provided by the patient.  Abdominal Pain   Past Medical History  Diagnosis Date  . Abnormal Pap smear 2012  . Preeclampsia    Past Surgical History  Procedure Laterality Date  . Cesarean section  2005  . Colposcopy  2012   Family History  Problem Relation Age of Onset  . Hypertension Mother   . Diabetes Mother   . Hypertension Father    Social History  Substance Use Topics  . Smoking status: Never Smoker   . Smokeless tobacco: Never Used  . Alcohol Use: No   OB History    Gravida Para Term Preterm AB TAB SAB Ectopic Multiple Living   2 1 0 1 1 0 0 0 0 1      Review of Systems  Gastrointestinal: Positive for abdominal pain.  All other systems reviewed and are negative.     Allergies  Codeine  Home Medications   Prior to Admission medications   Medication Sig Start Date End Date Taking? Authorizing Provider  ibuprofen (ADVIL,MOTRIN) 600 MG tablet Take 1 tablet (600 mg total) by mouth every 8 (eight) hours as needed. 03/13/15   Sherwood Gambler, MD  Multiple Vitamin (MULTIVITAMIN WITH MINERALS) TABS tablet Take 1 tablet by mouth daily.    Historical Provider, MD  promethazine (PHENERGAN) 25 MG tablet  Take 1 tablet (25 mg total) by mouth every 8 (eight) hours as needed for nausea or vomiting. Patient not taking: Reported on 03/13/2015 02/01/15   Arnoldo Morale, MD   BP 116/68 mmHg  Pulse 85  Temp(Src) 97.4 F (36.3 C) (Oral)  Resp 20  SpO2 100%  LMP 12/05/2014 Physical Exam  Constitutional: She is oriented to person, place, and time. She appears well-developed and well-nourished.  Non-toxic appearance. No distress.  HENT:  Head: Normocephalic and atraumatic.  Eyes: Conjunctivae, EOM and lids are normal. Pupils are equal, round, and reactive to light.  Neck: Normal range of motion. Neck supple. No tracheal deviation present. No thyroid mass present.  Cardiovascular: Normal rate, regular rhythm and normal heart sounds.  Exam reveals no gallop.   No murmur heard. Pulmonary/Chest: Effort normal and breath sounds normal. No stridor. No respiratory distress. She has no decreased breath sounds. She has no wheezes. She has no rhonchi. She has no rales.  Abdominal: Soft. Normal appearance and bowel sounds are normal. She exhibits no distension. There is tenderness in the epigastric area. There is no rigidity, no rebound, no guarding and no CVA tenderness.    Musculoskeletal: Normal range of motion. She exhibits no edema or tenderness.  Neurological: She is alert and oriented to person, place, and time. She has normal strength. No cranial nerve deficit or sensory deficit. GCS eye subscore is 4. GCS verbal subscore is 5. GCS motor subscore is  6.  Skin: Skin is warm and dry. No abrasion and no rash noted.  Psychiatric: She has a normal mood and affect. Her speech is normal and behavior is normal.  Nursing note and vitals reviewed.   ED Course  Procedures (including critical care time) Labs Review Labs Reviewed  URINE CULTURE  URINALYSIS, ROUTINE W REFLEX MICROSCOPIC (NOT AT [facility])    Imaging Review No results found. I have personally reviewed and evaluated these images and lab results as  part of my medical decision-making.   EKG Interpretation None      MDM   Final diagnoses:  None    Patient given GI cocktail and feels better. Urinalysis negative. Patient stable for discharge    Lacretia Leigh, MD 07/05/15 [phone]

## 2015-07-05 NOTE — Discharge Instructions (Signed)

## 2015-07-05 NOTE — ED Notes (Signed)
Pt complains of severe abd pain everytime she eats, also she thinks she may have a UTI

## 2015-07-06 LAB — URINE CULTURE

## 2015-07-13 ENCOUNTER — Telehealth: Payer: Self-pay | Admitting: Internal Medicine

## 2015-07-13 ENCOUNTER — Ambulatory Visit: Payer: Medicaid Other | Admitting: Internal Medicine

## 2015-07-13 NOTE — Telephone Encounter (Signed)
Patient would like to be contacted by the doctor. No reason specified.

## 2015-09-24 ENCOUNTER — Encounter (HOSPITAL_COMMUNITY): Payer: Self-pay

## 2015-09-24 ENCOUNTER — Inpatient Hospital Stay (HOSPITAL_COMMUNITY)
Admission: AD | Admit: 2015-09-24 | Discharge: 2015-09-24 | Disposition: A | Discharge status: Home / Self Care | Payer: Medicaid Other | Source: Ambulatory Visit | Attending: Obstetrics and Gynecology | Admitting: Obstetrics and Gynecology

## 2015-09-24 DIAGNOSIS — R1011 Right upper quadrant pain: Secondary | ICD-10-CM

## 2015-09-24 DIAGNOSIS — Z885 Allergy status to narcotic agent status: Secondary | ICD-10-CM | POA: Insufficient documentation

## 2015-09-24 DIAGNOSIS — Z3202 Encounter for pregnancy test, result negative: Secondary | ICD-10-CM | POA: Insufficient documentation

## 2015-09-24 DIAGNOSIS — N912 Amenorrhea, unspecified: Secondary | ICD-10-CM | POA: Insufficient documentation

## 2015-09-24 LAB — URINALYSIS, ROUTINE W REFLEX MICROSCOPIC
Bilirubin Urine: NEGATIVE
Glucose, UA: NEGATIVE mg/dL
HGB URINE DIPSTICK: NEGATIVE
KETONES UR: NEGATIVE mg/dL
Nitrite: NEGATIVE
PROTEIN: NEGATIVE mg/dL
Specific Gravity, Urine: 1.025 (ref 1.005–1.030)
pH: 6 (ref 5.0–8.0)

## 2015-09-24 LAB — URINE MICROSCOPIC-ADD ON

## 2015-09-24 LAB — GLUCOSE, CAPILLARY: Glucose-Capillary: 98 mg/dL (ref 65–99)

## 2015-09-24 LAB — POCT PREGNANCY, URINE: PREG TEST UR: NEGATIVE

## 2015-09-24 NOTE — Discharge Instructions (Signed)
Biliary Colic Biliary colic is a pain in the upper abdomen. The pain:  Is usually felt on the right side of the abdomen, but it may also be felt in the center of the abdomen, just below the breastbone (sternum).  May spread back toward the right shoulder blade.  May be steady or irregular.  May be accompanied by nausea and vomiting. Most of the time, the pain goes away in 1-5 hours. After the most intense pain passes, the abdomen may continue to ache mildly for about 24 hours. Biliary colic is caused by a blockage in the bile duct. The bile duct is a pathway that carries bile--a liquid that helps to digest fats--from the gallbladder to the small intestine. Biliary colic usually occurs after eating, when the digestive system demands bile. The pain develops when muscle cells contract forcefully to try to move the blockage so that bile can get by. HOME CARE INSTRUCTIONS  Take medicines only as directed by your health care provider.  Drink enough fluid to keep your urine clear or pale yellow.  Avoid fatty, greasy, and fried foods. These kinds of foods increase your body's demand for bile.  Avoid any foods that make your pain worse.  Avoid overeating.  Avoid having a large meal after fasting. SEEK MEDICAL CARE IF:  You develop a fever.  Your pain gets worse.  You vomit.  You develop nausea that prevents you from eating and drinking. SEEK IMMEDIATE MEDICAL CARE IF:  You suddenly develop a fever and shaking chills.  You develop a yellowish discoloration (jaundice) of:  Skin.  Whites of the eyes.  Mucous membranes.  You have continuous or severe pain that is not relieved with medicines.  You have nausea and vomiting that is not relieved with medicines.  You develop dizziness or you faint.   This information is not intended to replace advice given to you by your health care provider. Make sure you discuss any questions you have with your health care provider.   Document  Released: 08/20/2005 Document Revised: 08/03/2014 Document Reviewed: 12/29/2013 Elsevier Interactive Patient Education 2016 [employer] Fat and Cholesterol Restricted Diet High levels of fat and cholesterol in your blood may lead to various health problems, such as diseases of the heart, blood vessels, gallbladder, liver, and pancreas. Fats are concentrated sources of energy that come in various forms. Certain types of fat, including saturated fat, may be harmful in excess. Cholesterol is a substance needed by your body in small amounts. Your body makes all the cholesterol it needs. Excess cholesterol comes from the food you eat. When you have high levels of cholesterol and saturated fat in your blood, health problems can develop because the excess fat and cholesterol will gather along the walls of your blood vessels, causing them to narrow. Choosing the right foods will help you control your intake of fat and cholesterol. This will help keep the levels of these substances in your blood within normal limits and reduce your risk of disease. WHAT IS MY PLAN? Your health care provider recommends that you:  Get no more than __________ % of the total calories in your daily diet from fat.  Limit your intake of saturated fat to less than ______% of your total calories each day.  Limit the amount of cholesterol in your diet to less than _________mg per day. WHAT TYPES OF FAT SHOULD I CHOOSE?  Choose healthy fats more often. Choose monounsaturated and polyunsaturated fats, such as olive and canola oil, flaxseeds, walnuts,  almonds, and seeds.  Eat more omega-3 fats. Good choices include salmon, mackerel, sardines, tuna, flaxseed oil, and ground flaxseeds. Aim to eat fish at least two times a week.  Limit saturated fats. Saturated fats are primarily found in animal products, such as meats, butter, and cream. Plant sources of saturated fats include palm oil, palm kernel oil, and coconut oil.  Avoid foods  with partially hydrogenated oils in them. These contain trans fats. Examples of foods that contain trans fats are stick margarine, some tub margarines, cookies, crackers, and other baked goods. WHAT GENERAL GUIDELINES DO I NEED TO FOLLOW? These guidelines for healthy eating will help you control your intake of fat and cholesterol:  Check food labels carefully to identify foods with trans fats or high amounts of saturated fat.  Fill one half of your plate with vegetables and green salads.  Fill one fourth of your plate with whole grains. Look for the word "whole" as the first word in the ingredient list.  Fill one fourth of your plate with lean protein foods.  Limit fruit to two servings a day. Choose fruit instead of juice.  Eat more foods that contain soluble fiber. Examples of foods that contain this type of fiber are apples, broccoli, carrots, beans, peas, and barley. Aim to get 20-30 g of fiber per day.  Eat more home-cooked food and less restaurant, buffet, and fast food.  Limit or avoid alcohol.  Limit foods high in starch and sugar.  Limit fried foods.  Cook foods using methods other than frying. Baking, boiling, grilling, and broiling are all great options.  Lose weight if you are overweight. Losing just 5-10% of your initial body weight can help your overall health and prevent diseases such as diabetes and heart disease. WHAT FOODS CAN I EAT? Grains Whole grains, such as whole wheat or whole grain breads, crackers, cereals, and pasta. Unsweetened oatmeal, bulgur, barley, quinoa, or brown rice. Corn or whole wheat flour tortillas. Vegetables Fresh or frozen vegetables (raw, steamed, roasted, or grilled). Green salads. Fruits All fresh, canned (in natural juice), or frozen fruits. Meat and Other Protein Products Ground beef (85% or leaner), grass-fed beef, or beef trimmed of fat. Skinless chicken or [place]. Ground chicken or [place]. Pork trimmed of fat. All fish and seafood.  Eggs. Dried beans, peas, or lentils. Unsalted nuts or seeds. Unsalted canned or dry beans. Dairy Low-fat dairy products, such as skim or 1% milk, 2% or reduced-fat cheeses, low-fat ricotta or cottage cheese, or plain low-fat yogurt. Fats and Oils Tub margarines without trans fats. Light or reduced-fat mayonnaise and salad dressings. Avocado. Olive, canola, sesame, or safflower oils. Natural peanut or almond butter (choose ones without added sugar and oil). The items listed above may not be a complete list of recommended foods or beverages. Contact your dietitian for more options. WHAT FOODS ARE NOT RECOMMENDED? Grains White bread. White pasta. White rice. Cornbread. Bagels, pastries, and croissants. Crackers that contain trans fat. Vegetables White potatoes. Corn. Creamed or fried vegetables. Vegetables in a cheese sauce. Fruits Dried fruits. Canned fruit in light or heavy syrup. Fruit juice. Meat and Other Protein Products Fatty cuts of meat. Ribs, chicken wings, bacon, sausage, bologna, salami, chitterlings, fatback, hot dogs, bratwurst, and packaged luncheon meats. Liver and organ meats. Dairy Whole or 2% milk, cream, half-and-half, and cream cheese. Whole milk cheeses. Whole-fat or sweetened yogurt. Full-fat cheeses. Nondairy creamers and whipped toppings. Processed cheese, cheese spreads, or cheese curds. Sweets and Desserts Corn syrup, sugars, honey,  and molasses. Candy. Jam and jelly. Syrup. Sweetened cereals. Cookies, pies, cakes, donuts, muffins, and ice cream. Fats and Oils Butter, stick margarine, lard, shortening, ghee, or bacon fat. Coconut, palm kernel, or palm oils. Beverages Alcohol. Sweetened drinks (such as sodas, lemonade, and fruit drinks or punches). The items listed above may not be a complete list of foods and beverages to avoid. Contact your dietitian for more information.   This information is not intended to replace advice given to you by your health care provider.  Make sure you discuss any questions you have with your health care provider.   Document Released: 03/19/2005 Document Revised: 04/09/2014 Document Reviewed: 06/17/2013 Elsevier Interactive Patient Education [employer].

## 2015-09-24 NOTE — MAU Note (Signed)
Patient presents with c/o light headedness since yesterday, LPM 08/16/15 has missed her period, mid abdominal pain

## 2015-09-24 NOTE — MAU Provider Note (Signed)
History   [id number] not pregnant in with feeling weak, nausea, upper abd pain for past 3 days. Pt recently just returned from vacation in [place]. Diet reviewed and pt had lots of fried foods while on vacation. No acute distress.  CSN: [id number]  Arrival date & time 09/24/15  1823   None     Chief Complaint  Patient presents with  . Dizziness  . Abdominal Pain  . Amenorrhea    HPI  Past Medical History  Diagnosis Date  . Abnormal Pap smear 2012  . Preeclampsia     Past Surgical History  Procedure Laterality Date  . Cesarean section  2005  . Colposcopy  2012    Family History  Problem Relation Age of Onset  . Hypertension Mother   . Diabetes Mother   . Hypertension Father     Social History  Substance Use Topics  . Smoking status: Never Smoker   . Smokeless tobacco: Never Used  . Alcohol Use: No    OB History    Gravida Para Term Preterm AB TAB SAB Ectopic Multiple Living   2 1 0 1 1 1 0 0 0 1       Review of Systems  Constitutional: Negative.   HENT: Negative.   Eyes: Negative.   Respiratory: Negative.   Cardiovascular: Negative.   Gastrointestinal: Positive for nausea and abdominal pain.  Endocrine: Negative.   Genitourinary: Negative.   Musculoskeletal: Negative.   Skin: Negative.   Allergic/Immunologic: Negative.   Neurological: Negative.   Hematological: Negative.   Psychiatric/Behavioral: Negative.     Allergies  Codeine  Home Medications  No current outpatient prescriptions on file.  BP 121/59 mmHg  Pulse 77  Temp(Src) 98.1 F (36.7 C) (Oral)  Resp 18  SpO2 100%  LMP 08/16/2015  Physical Exam  Constitutional: She is oriented to person, place, and time. She appears well-developed and well-nourished.  HENT:  Head: Normocephalic.  Eyes: Pupils are equal, round, and reactive to light.  Neck: Normal range of motion.  Cardiovascular: Normal rate, regular rhythm, normal heart sounds and intact distal pulses.   Pulmonary/Chest: Effort  normal and breath sounds normal.  Abdominal: There is tenderness.  RUQ tenderness  Genitourinary: Vagina normal and uterus normal.  Musculoskeletal: Normal range of motion.  Neurological: She is alert and oriented to person, place, and time. She has normal reflexes.  Skin: Skin is warm and dry.  Psychiatric: She has a normal mood and affect. Her behavior is normal. Judgment and thought content normal.    MAU Course  Procedures (including critical care time)  Labs Reviewed  GLUCOSE, CAPILLARY  URINALYSIS, ROUTINE W REFLEX MICROSCOPIC (NOT AT [facility])  POCT PREGNANCY, URINE   No results found.   No diagnosis found.    MDM  Labs WNL, Mild RUQ tenderness with exam. Pt to see PCP and discussed symptoms. Suspect Cholethiasis but no acute distress. D/c home

## 2015-11-16 ENCOUNTER — Encounter (HOSPITAL_COMMUNITY): Payer: Self-pay | Admitting: Emergency Medicine

## 2015-11-16 ENCOUNTER — Emergency Department (HOSPITAL_COMMUNITY)
Admission: EM | Admit: 2015-11-16 | Discharge: 2015-11-16 | Disposition: A | Discharge status: Home / Self Care | Payer: Self-pay | Attending: Emergency Medicine | Admitting: Emergency Medicine

## 2015-11-16 DIAGNOSIS — X500XXA Overexertion from strenuous movement or load, initial encounter: Secondary | ICD-10-CM | POA: Insufficient documentation

## 2015-11-16 DIAGNOSIS — Y999 Unspecified external cause status: Secondary | ICD-10-CM | POA: Insufficient documentation

## 2015-11-16 DIAGNOSIS — S233XXA Sprain of ligaments of thoracic spine, initial encounter: Secondary | ICD-10-CM | POA: Insufficient documentation

## 2015-11-16 DIAGNOSIS — Z79899 Other long term (current) drug therapy: Secondary | ICD-10-CM | POA: Insufficient documentation

## 2015-11-16 DIAGNOSIS — S239XXA Sprain of unspecified parts of thorax, initial encounter: Secondary | ICD-10-CM

## 2015-11-16 DIAGNOSIS — Y929 Unspecified place or not applicable: Secondary | ICD-10-CM | POA: Insufficient documentation

## 2015-11-16 DIAGNOSIS — Y9389 Activity, other specified: Secondary | ICD-10-CM | POA: Insufficient documentation

## 2015-11-16 MED ORDER — IBUPROFEN 800 MG PO TABS: 800.0000 mg | ORAL_TABLET | Freq: Three times a day (TID) | ORAL | 0 refills | Status: DC

## 2015-11-16 MED ORDER — TRAMADOL HCL 50 MG PO TABS: 50.0000 mg | ORAL_TABLET | Freq: Four times a day (QID) | ORAL | 0 refills | Status: DC | PRN

## 2015-11-16 MED ORDER — TRAMADOL HCL 50 MG PO TABS
50.0000 mg | ORAL_TABLET | Freq: Once | ORAL | Status: AC
  Administered 2015-11-16: 50 mg via ORAL
  Filled 2015-11-16: qty 1

## 2015-11-16 MED ORDER — CYCLOBENZAPRINE HCL 10 MG PO TABS: 10.0000 mg | ORAL_TABLET | Freq: Two times a day (BID) | ORAL | 0 refills | Status: DC | PRN

## 2015-11-16 MED ORDER — CYCLOBENZAPRINE HCL 10 MG PO TABS
10.0000 mg | ORAL_TABLET | Freq: Once | ORAL | Status: AC
  Administered 2015-11-16: 10 mg via ORAL
  Filled 2015-11-16: qty 1

## 2015-11-16 MED ORDER — IBUPROFEN 800 MG PO TABS
800.0000 mg | ORAL_TABLET | Freq: Once | ORAL | Status: AC
  Administered 2015-11-16: 800 mg via ORAL
  Filled 2015-11-16: qty 1

## 2015-11-16 NOTE — ED Provider Notes (Signed)
[facility] DEPT Provider Note   CSN: [id number] Arrival date & time: 11/16/15  1652  By signing my name below, I, Rayna Sexton, attest that this documentation has been prepared under the direction and in the presence of Delsa Grana, PA-C. Electronically Signed: Rayna Sexton, ED Scribe. 11/16/15. 7:33 PM.   History   Chief Complaint Chief Complaint  Patient presents with  . Back Pain    HPI HPI Comments: Michaela Carrillo is a 30 y.o. female who presents to the Emergency Department complaining of constant, moderate, diffuse, upper back pain onset yesterday. She reports associated, moderate, diffuse, posterior neck pain. Her pain worsens when bending over or with movement. Pt has taken OTC ibuprofen w/o significant relief. 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 She denies any UE tingling or numbness, HA, fever, neck stiffness.  No other associated symptoms at this time.   The history is provided by the patient. No language interpreter was used.    Past Medical History:  Diagnosis Date  . Abnormal Pap smear 2012  . Preeclampsia     Patient Active Problem List   Diagnosis Date Noted  . TOE PAIN 03/01/2010  . SHIN SPLINTS 01/05/2010  . LEG CRAMPS 11/30/2009  . Anemia 07/04/2009  . TINEA CORPORIS 06/30/2009  . HEADACHE, TENSION 09/06/2008  . 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 Abnormal Pap smear of cervix 05/06/2008  . MORBID OBESITY 04/09/2008  . INTERNAL HEMORRHOIDS 04/09/2008  . HELICOBACTER PYLORI INFECTION, HX OF 04/09/2008    Past Surgical History:  Procedure Laterality Date  . CESAREAN SECTION  2005  . COLPOSCOPY  2012    OB History    Gravida Para Term Preterm AB Living   2 1 0 1 1 1    SAB TAB Ectopic Multiple Live Births   0 1 0 0         Home Medications    Prior to Admission medications   Medication Sig Start Date End Date Taking? Authorizing Provider  ibuprofen (ADVIL,MOTRIN) 600 MG  tablet Take 1 tablet (600 mg total) by mouth every 8 (eight) hours as needed. Patient not taking: Reported on 07/05/2015 03/13/15   Sherwood Gambler, MD  pantoprazole (PROTONIX) 20 MG tablet Take 1 tablet (20 mg total) by mouth daily. 07/05/15   Lacretia Leigh, MD  promethazine (PHENERGAN) 25 MG tablet Take 1 tablet (25 mg total) by mouth every 8 (eight) hours as needed for nausea or vomiting. Patient not taking: Reported on 03/13/2015 02/01/15   Arnoldo Morale, MD  sucralfate (CARAFATE) 1 g tablet Take 1 tablet (1 g total) by mouth 4 (four) times daily. 07/05/15   Lacretia Leigh, MD    Family History Family History  Problem Relation Age of Onset  . Hypertension Mother   . Diabetes Mother   . Hypertension Father     Social History Social History  Substance Use Topics  . Smoking status: Never Smoker  . Smokeless tobacco: Never Used  . Alcohol use No     Allergies   Codeine   Review of Systems Review of Systems  Musculoskeletal: Positive for back pain, myalgias and neck pain. Negative for arthralgias, gait problem and joint swelling.  Skin: Negative for color change and wound.  Neurological: Negative for numbness and headaches.  All other systems reviewed and are negative.  Physical Exam Updated Vital Signs BP 101/63 (BP Location: Left Arm)   Pulse 69   Temp 97.9 F (  36.6 C) (Oral)   Resp 16   LMP 11/16/2015   SpO2 100%   Physical Exam  Constitutional: She is oriented to person, place, and time. She appears well-developed and well-nourished. No distress.  HENT:  Head: Normocephalic and atraumatic.  Nose: Nose normal.  Mouth/Throat: Oropharynx is clear and moist. No oropharyngeal exudate.  Eyes: Conjunctivae and EOM are normal. Pupils are equal, round, and reactive to light.  Neck: Normal range of motion and phonation normal. Neck supple. Muscular tenderness present. No spinous process tenderness present. No neck rigidity. No edema, no erythema and normal range of motion present.   Cardiovascular: Normal rate.   Pulmonary/Chest: Effort normal. No respiratory distress.  Abdominal: Soft.  Musculoskeletal: Normal range of motion. She exhibits tenderness.       Cervical back: She exhibits tenderness. She exhibits normal range of motion, no bony tenderness, no edema, no deformity and no spasm.       Thoracic back: She exhibits tenderness. She exhibits normal range of motion, no bony tenderness, no edema, no deformity and no spasm.       Lumbar back: Normal.  TTP to the thoracic paraspinal muscles and bilateral trapezius musculature. No midline spinal tenderness.   Neurological: She is alert and oriented to person, place, and time. She has normal strength. No sensory deficit.  nml strength and sensation of the BUE  Skin: Skin is warm and dry. She is not diaphoretic.  Psychiatric: She has a normal mood and affect.  Nursing note and vitals reviewed.  ED Treatments / Results  Labs (all labs ordered are listed, but only abnormal results are displayed) Labs Reviewed - No data to display  EKG  EKG Interpretation None       Radiology No results found.  Procedures Procedures  DIAGNOSTIC STUDIES: Oxygen Saturation is 100% on RA, normal by my interpretation.    COORDINATION OF CARE: 7:33 PM Discussed next steps with pt. Pt verbalized understanding and is agreeable with the plan.    Medications Ordered in ED Medications - No data to display   Initial Impression / Assessment and Plan / ED Course  I have reviewed the triage vital signs and the nursing notes.  Pertinent labs & imaging results that were available during my care of the patient were reviewed by me and considered in my medical decision making (see chart for details).  Clinical Course    Patient with back pain and neck pain.  TTP in thoracic and neck muscles, trapezius and thoracic paraspinals.  No midline tenderness.  No neurological deficits and normal neuro exam.  Patient is ambulatory.  No  concerning signs or symptoms of meningitis.  No trauma or injury, suspect mild muscle strain.  Supportive care and return precaution discussed. Appears safe for discharge at this time. Follow up as indicated in discharge paperwork.    I personally performed the services described in this documentation, which was scribed in my presence. The recorded information has been reviewed and is accurate.    Final Clinical Impressions(s) / ED Diagnoses   Final diagnoses:  Thoracic back sprain, initial encounter    New Prescriptions Discharge Medication List as of 11/16/2015  7:38 PM    START taking these medications   Details  cyclobenzaprine (FLEXERIL) 10 MG tablet Take 1 tablet (10 mg total) by mouth 2 (two) times daily as needed for muscle spasms., Starting Wed 11/16/2015, Print    traMADol (ULTRAM) 50 MG tablet Take 1 tablet (50 mg total) by mouth every 6 (  six) hours as needed., Starting Wed 11/16/2015, Print         Delsa Grana, PA-C 11/19/15 0110    Daleen Bo, MD 11/21/15 2149

## 2015-11-16 NOTE — ED Triage Notes (Signed)
Pt c/o Upper back pain and neck pain since yesterday. 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 Pt thinks she pulled a muscle. A&Ox4 and ambulatory. Pt denies numbness and tingling.

## 2016-05-09 ENCOUNTER — Ambulatory Visit (INDEPENDENT_AMBULATORY_CARE_PROVIDER_SITE_OTHER): Payer: BLUE CROSS/BLUE SHIELD | Admitting: Obstetrics & Gynecology

## 2016-05-09 ENCOUNTER — Other Ambulatory Visit (HOSPITAL_COMMUNITY)
Admission: RE | Admit: 2016-05-09 | Discharge: 2016-05-09 | Disposition: A | Discharge status: Home / Self Care | Payer: BLUE CROSS/BLUE SHIELD | Source: Ambulatory Visit | Attending: Obstetrics & Gynecology | Admitting: Obstetrics & Gynecology

## 2016-05-09 ENCOUNTER — Encounter: Payer: Self-pay | Admitting: Obstetrics & Gynecology

## 2016-05-09 DIAGNOSIS — Z0000 Encounter for general adult medical examination without abnormal findings: Secondary | ICD-10-CM | POA: Diagnosis not present

## 2016-05-09 DIAGNOSIS — Z01419 Encounter for gynecological examination (general) (routine) without abnormal findings: Secondary | ICD-10-CM | POA: Diagnosis present

## 2016-05-09 DIAGNOSIS — Z1151 Encounter for screening for human papillomavirus (HPV): Secondary | ICD-10-CM | POA: Diagnosis present

## 2016-05-09 DIAGNOSIS — Z113 Encounter for screening for infections with a predominantly sexual mode of transmission: Secondary | ICD-10-CM | POA: Diagnosis present

## 2016-05-09 DIAGNOSIS — R87612 Low grade squamous intraepithelial lesion on cytologic smear of cervix (LGSIL): Secondary | ICD-10-CM

## 2016-05-09 NOTE — Progress Notes (Signed)
Patient is in the office for annual exam, states that she previously was having irregular periods but LMP was 05-02-16 and she had a negative UPT at home.

## 2016-05-09 NOTE — Patient Instructions (Signed)

## 2016-05-09 NOTE — Progress Notes (Signed)
Patient ID: Michaela Carrillo, female   DOB: 1986-02-10, 31 y.o.   MRN: [id number]  Chief Complaint  Patient presents with  . GYN    Patient is in the office for annual exam.    HPI Michaela Carrillo is a 31 y.o. female.  [id number] Patient's last menstrual period was 05/02/2016 (exact date). Minimal menstrual irregularity, hasn't conceived with no BCM HPI  Past Medical History:  Diagnosis Date  . Abnormal Pap smear 2012  . Preeclampsia     Past Surgical History:  Procedure Laterality Date  . CESAREAN SECTION  2005  . COLPOSCOPY  2012    Family History  Problem Relation Age of Onset  . Hypertension Mother   . Diabetes Mother   . Hypertension Father     Social History Social History  Substance Use Topics  . Smoking status: Never Smoker  . Smokeless tobacco: Never Used  . Alcohol use No    Allergies  Allergen Reactions  . Codeine Itching    Current Outpatient Prescriptions  Medication Sig Dispense Refill  . cyclobenzaprine (FLEXERIL) 10 MG tablet Take 1 tablet (10 mg total) by mouth 2 (two) times daily as needed for muscle spasms. (Patient not taking: Reported on 05/09/2016) 20 tablet 0  . ibuprofen (ADVIL,MOTRIN) 800 MG tablet Take 1 tablet (800 mg total) by mouth 3 (three) times daily. (Patient not taking: Reported on 05/09/2016) 21 tablet 0  . pantoprazole (PROTONIX) 20 MG tablet Take 1 tablet (20 mg total) by mouth daily. (Patient not taking: Reported on 05/09/2016) 30 tablet 0  . promethazine (PHENERGAN) 25 MG tablet Take 1 tablet (25 mg total) by mouth every 8 (eight) hours as needed for nausea or vomiting. (Patient not taking: Reported on 03/13/2015) 20 tablet 0  . sucralfate (CARAFATE) 1 g tablet Take 1 tablet (1 g total) by mouth 4 (four) times daily. (Patient not taking: Reported on 05/09/2016) 30 tablet 0  . traMADol (ULTRAM) 50 MG tablet Take 1 tablet (50 mg total) by mouth every 6 (six) hours as needed. (Patient not taking: Reported on 05/09/2016) 15 tablet 0    No current facility-administered medications for this visit.     Review of Systems Review of Systems  Constitutional: Negative.   Respiratory: Negative.   Gastrointestinal: Negative.   Genitourinary: Negative for menstrual problem, pelvic pain and vaginal discharge.    Blood pressure (!) 105/59, pulse 70, height 5\' 5"  (1.651 m), weight (!) 306 lb 4.8 oz (138.9 kg), last menstrual period 05/02/2016.  Physical Exam Physical Exam  Constitutional: She is oriented to person, place, and time. She appears well-developed. No distress.  Pulmonary/Chest: Effort normal.  Breasts: breasts appear normal, no suspicious masses, no skin or nipple changes or axillary nodes.   Abdominal: She exhibits no distension. There is no tenderness.  obese  Genitourinary: Uterus normal. Vaginal discharge found.  Neurological: She is alert and oriented to person, place, and time.  Psychiatric: She has a normal mood and affect. Her behavior is normal.    Data Reviewed Pa results and colposcopy note  Assessment    Patient Active Problem List   Diagnosis Date Noted  . TOE PAIN 03/01/2010  . SHIN SPLINTS 01/05/2010  . LEG CRAMPS 11/30/2009  . Anemia 07/04/2009  . TINEA CORPORIS 06/30/2009  . HEADACHE, TENSION 09/06/2008  . 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 Abnormal Pap smear of cervix 05/06/2008  . MORBID OBESITY 04/09/2008  . INTERNAL HEMORRHOIDS 04/09/2008  . HELICOBACTER PYLORI INFECTION, HX OF 04/09/2008   Well  woman exam, h/o LSIL pap Plan    If pap is normal would repeat in 1 year Considering infertility evaluation         Emeterio Reeve 05/09/2016, 11:42 AM

## 2016-05-10 LAB — CERVICOVAGINAL ANCILLARY ONLY
BACTERIAL VAGINITIS: POSITIVE — AB
CANDIDA VAGINITIS: NEGATIVE
Trichomonas: NEGATIVE

## 2016-05-10 LAB — GC/CHLAMYDIA PROBE AMP (~~LOC~~) NOT AT ARMC
Chlamydia: NEGATIVE
NEISSERIA GONORRHEA: NEGATIVE

## 2016-05-14 ENCOUNTER — Other Ambulatory Visit: Payer: Self-pay

## 2016-05-14 MED ORDER — METRONIDAZOLE 500 MG PO TABS: 500.0000 mg | ORAL_TABLET | Freq: Two times a day (BID) | ORAL | 0 refills | Status: DC

## 2016-05-14 NOTE — Progress Notes (Signed)
Spoke with pt states Flagyl is not at the pharmacy.  Confirmed with [facility], Flagyl sent today.

## 2016-05-15 LAB — CYTOLOGY - PAP
Diagnosis: UNDETERMINED — AB
HPV (WINDOPATH): DETECTED — AB

## 2016-05-17 ENCOUNTER — Telehealth: Payer: Self-pay

## 2016-05-17 NOTE — Telephone Encounter (Signed)
Patient called in, stated that phone died and that she is aware of results.

## 2016-05-17 NOTE — Telephone Encounter (Signed)
Contacted patient and advised of results, phone disconnected, called back and left vm.

## 2016-06-08 ENCOUNTER — Emergency Department (HOSPITAL_BASED_OUTPATIENT_CLINIC_OR_DEPARTMENT_OTHER)
Admission: EM | Admit: 2016-06-08 | Discharge: 2016-06-08 | Disposition: A | Discharge status: Home / Self Care | Payer: BLUE CROSS/BLUE SHIELD | Attending: Emergency Medicine | Admitting: Emergency Medicine

## 2016-06-08 ENCOUNTER — Encounter (HOSPITAL_BASED_OUTPATIENT_CLINIC_OR_DEPARTMENT_OTHER): Payer: Self-pay | Admitting: *Deleted

## 2016-06-08 DIAGNOSIS — R42 Dizziness and giddiness: Secondary | ICD-10-CM | POA: Diagnosis present

## 2016-06-08 DIAGNOSIS — D649 Anemia, unspecified: Secondary | ICD-10-CM | POA: Diagnosis not present

## 2016-06-08 DIAGNOSIS — Z791 Long term (current) use of non-steroidal anti-inflammatories (NSAID): Secondary | ICD-10-CM | POA: Insufficient documentation

## 2016-06-08 DIAGNOSIS — Z79899 Other long term (current) drug therapy: Secondary | ICD-10-CM | POA: Diagnosis not present

## 2016-06-08 LAB — COMPREHENSIVE METABOLIC PANEL
ALBUMIN: 3.3 g/dL — AB (ref 3.5–5.0)
ALT: 13 U/L — AB (ref 14–54)
AST: 18 U/L (ref 15–41)
Alkaline Phosphatase: 85 U/L (ref 38–126)
Anion gap: 6 (ref 5–15)
BUN: 12 mg/dL (ref 6–20)
CHLORIDE: 104 mmol/L (ref 101–111)
CO2: 28 mmol/L (ref 22–32)
Calcium: 8.5 mg/dL — ABNORMAL LOW (ref 8.9–10.3)
Creatinine, Ser: 0.75 mg/dL (ref 0.44–1.00)
GFR calc Af Amer: >60 mL/min (ref >60)
GFR calc non Af Amer: >60 mL/min (ref >60)
GLUCOSE: 85 mg/dL (ref 65–99)
POTASSIUM: 3.7 mmol/L (ref 3.5–5.1)
Sodium: 138 mmol/L (ref 135–145)
Total Bilirubin: 0.4 mg/dL (ref 0.3–1.2)
Total Protein: 7.1 g/dL (ref 6.5–8.1)

## 2016-06-08 LAB — CBC WITH DIFFERENTIAL/PLATELET
BASOS ABS: 0 10*3/uL (ref 0.0–0.1)
Basophils Relative: 0 %
Eosinophils Absolute: 0.2 10*3/uL (ref 0.0–0.7)
Eosinophils Relative: 2 %
HCT: 32 % — ABNORMAL LOW (ref 36.0–46.0)
Hemoglobin: 9.4 g/dL — ABNORMAL LOW (ref 12.0–15.0)
LYMPHS ABS: 1.9 10*3/uL (ref 0.7–4.0)
Lymphocytes Relative: 19 %
MCH: 19.3 pg — ABNORMAL LOW (ref 26.0–34.0)
MCHC: 29.4 g/dL — ABNORMAL LOW (ref 30.0–36.0)
MCV: 65.6 fL — ABNORMAL LOW (ref 78.0–100.0)
MONOS PCT: 4 %
Monocytes Absolute: 0.4 10*3/uL (ref 0.1–1.0)
NEUTROS PCT: 75 %
Neutro Abs: 7.7 10*3/uL (ref 1.7–7.7)
PLATELETS: 415 10*3/uL — AB (ref 150–400)
RBC: 4.88 MIL/uL (ref 3.87–5.11)
RDW: 20.4 % — AB (ref 11.5–15.5)
WBC: 10.2 10*3/uL (ref 4.0–10.5)

## 2016-06-08 LAB — URINALYSIS, ROUTINE W REFLEX MICROSCOPIC
Bilirubin Urine: NEGATIVE
GLUCOSE, UA: NEGATIVE mg/dL
HGB URINE DIPSTICK: NEGATIVE
Ketones, ur: NEGATIVE mg/dL
Leukocytes, UA: NEGATIVE
Nitrite: NEGATIVE
PH: 8.5 — AB (ref 5.0–8.0)
Protein, ur: NEGATIVE mg/dL
SPECIFIC GRAVITY, URINE: 1.019 (ref 1.005–1.030)

## 2016-06-08 LAB — CBG MONITORING, ED: GLUCOSE-CAPILLARY: 55 mg/dL — AB (ref 65–99)

## 2016-06-08 LAB — PREGNANCY, URINE: Preg Test, Ur: NEGATIVE

## 2016-06-08 MED ORDER — FERROUS SULFATE 325 (65 FE) MG PO TABS: 325.0000 mg | ORAL_TABLET | Freq: Every day | ORAL | 0 refills | Status: DC

## 2016-06-08 MED ORDER — IBUPROFEN 800 MG PO TABS
800.0000 mg | ORAL_TABLET | Freq: Once | ORAL | Status: AC
  Administered 2016-06-08: 800 mg via ORAL
  Filled 2016-06-08: qty 1

## 2016-06-08 NOTE — ED Triage Notes (Addendum)
Dizziness x 2 days. Headache. Menses. Her feet hurt. Toes have been tingling. She can't get an appointment to see her MD. Drove herself here today.

## 2016-06-08 NOTE — Discharge Instructions (Signed)
Your blood sugars are normal here. Do not skip meals. Take a multivitamin, and the iron prescription daily.

## 2016-06-08 NOTE — ED Provider Notes (Signed)
[facility] DEPT Provider Note   CSN: [id number] Arrival date & time: 06/08/16  1254     History   Chief Complaint Chief Complaint  Patient presents with  . Dizziness    HPI Michaela Carrillo is a 31 y.o. female. Chief complaint is dizziness, headache, and concern about diabetes, and tingling in feet.  HPI: Patient states she's had a headache for several days. She's had tingling in her feet for 2 months. 2 years ago she was told that she was prediabetic, but her A1c hemoglobin "decreased". She admits that she is put on a lot of weight over the last few years. She is now 57. Has had normal menstrual cycles. Her last was 12-5 days ago.   No polyuria or polydipsia. Has had weight gain. She did not eat anything after midline last night was hoping to "find a doctor today to find out if I'm diabetic"  Past Medical History:  Diagnosis Date  . Abnormal Pap smear 2012  . Preeclampsia     Patient Active Problem List   Diagnosis Date Noted  . TOE PAIN 03/01/2010  . SHIN SPLINTS 01/05/2010  . LEG CRAMPS 11/30/2009  . Anemia 07/04/2009  . TINEA CORPORIS 06/30/2009  . HEADACHE, TENSION 09/06/2008  . 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 Abnormal Pap smear of cervix 05/06/2008  . MORBID OBESITY 04/09/2008  . INTERNAL HEMORRHOIDS 04/09/2008  . HELICOBACTER PYLORI INFECTION, HX OF 04/09/2008    Past Surgical History:  Procedure Laterality Date  . CESAREAN SECTION  2005  . COLPOSCOPY  2012    OB History    Gravida Para Term Preterm AB Living   2 1 0 1 1 1    SAB TAB Ectopic Multiple Live Births   0 1 0 0         Home Medications    Prior to Admission medications   Medication Sig Start Date End Date Taking? Authorizing Provider  cyclobenzaprine (FLEXERIL) 10 MG tablet Take 1 tablet (10 mg total) by mouth 2 (two) times daily as needed for muscle spasms. Patient not taking: Reported on 05/09/2016 11/16/15   Delsa Grana, PA-C  ferrous sulfate 325 (65 FE) MG tablet Take 1 tablet (325 mg  total) by mouth daily. 06/08/16   Tanna Furry, MD  ibuprofen (ADVIL,MOTRIN) 800 MG tablet Take 1 tablet (800 mg total) by mouth 3 (three) times daily. Patient not taking: Reported on 05/09/2016 11/16/15   Delsa Grana, PA-C  metroNIDAZOLE (FLAGYL) 500 MG tablet Take 1 tablet (500 mg total) by mouth 2 (two) times daily. 05/14/16   Woodroe Mode, MD  pantoprazole (PROTONIX) 20 MG tablet Take 1 tablet (20 mg total) by mouth daily. Patient not taking: Reported on 05/09/2016 07/05/15   Lacretia Leigh, MD  promethazine (PHENERGAN) 25 MG tablet Take 1 tablet (25 mg total) by mouth every 8 (eight) hours as needed for nausea or vomiting. Patient not taking: Reported on 03/13/2015 02/01/15   Arnoldo Morale, MD  sucralfate (CARAFATE) 1 g tablet Take 1 tablet (1 g total) by mouth 4 (four) times daily. Patient not taking: Reported on 05/09/2016 07/05/15   Lacretia Leigh, MD  traMADol (ULTRAM) 50 MG tablet Take 1 tablet (50 mg total) by mouth every 6 (six) hours as needed. Patient not taking: Reported on 05/09/2016 11/16/15   Delsa Grana, PA-C    Family History Family History  Problem Relation Age of Onset  . Hypertension Mother   . Diabetes Mother   . Hypertension Father     Social  History Social History  Substance Use Topics  . Smoking status: Never Smoker  . Smokeless tobacco: Never Used  . Alcohol use No     Allergies   Codeine   Review of Systems Review of Systems  Constitutional: Negative for appetite change, chills, diaphoresis, fatigue and fever.  HENT: Negative for mouth sores, sore throat and trouble swallowing.   Eyes: Negative for visual disturbance.  Respiratory: Negative for cough, chest tightness, shortness of breath and wheezing.   Cardiovascular: Negative for chest pain.  Gastrointestinal: Negative for abdominal distention, abdominal pain, diarrhea, nausea and vomiting.  Endocrine: Negative for polydipsia, polyphagia and polyuria.  Genitourinary: Negative for dysuria, frequency and hematuria.   Musculoskeletal: Negative for gait problem.  Skin: Negative for color change, pallor and rash.  Neurological: Positive for dizziness and headaches. Negative for syncope and light-headedness.       Occasional tingling in toes.  Hematological: Does not bruise/bleed easily.  Psychiatric/Behavioral: Negative for behavioral problems and confusion.     Physical Exam Updated Vital Signs BP 126/61 (BP Location: Left Arm)   Pulse 87   Temp 98.4 F (36.9 C) (Oral)   Resp 18   Ht 5\' 4"  (1.626 m)   Wt (!) 306 lb (138.8 kg)   LMP 06/02/2016   SpO2 99%   BMI 52.52 kg/m   Physical Exam  Constitutional: She is oriented to person, place, and time. She appears well-developed and well-nourished. No distress.  Obese black female. Awake and alert. Fasting blood sugar here 55. Given some juice.  HENT:  Head: Normocephalic.  Eyes: Conjunctivae are normal. Pupils are equal, round, and reactive to light. No scleral icterus.  Neck: Normal range of motion. Neck supple. No thyromegaly present.  Cardiovascular: Normal rate and regular rhythm.  Exam reveals no gallop and no friction rub.   No murmur heard. Pulmonary/Chest: Effort normal and breath sounds normal. No respiratory distress. She has no wheezes. She has no rales.  Abdominal: Soft. Bowel sounds are normal. She exhibits no distension. There is no tenderness. There is no rebound.  Musculoskeletal: Normal range of motion.  Neurological: She is alert and oriented to person, place, and time.  Moving all 4 extremities without difficulty. No strength or motor deficits. No decreased sensation/stocking paresthesias to suggest neuropathy.  Skin: Skin is warm and dry. No rash noted.  Psychiatric: She has a normal mood and affect. Her behavior is normal.     ED Treatments / Results  Labs (all labs ordered are listed, but only abnormal results are displayed) Labs Reviewed  CBC WITH DIFFERENTIAL/PLATELET - Abnormal; Notable for the following:        Result Value   Hemoglobin 9.4 (*)    HCT 32.0 (*)    MCV 65.6 (*)    MCH 19.3 (*)    MCHC 29.4 (*)    RDW 20.4 (*)    Platelets 415 (*)    All other components within normal limits  COMPREHENSIVE METABOLIC PANEL - Abnormal; Notable for the following:    Calcium 8.5 (*)    Albumin 3.3 (*)    ALT 13 (*)    All other components within normal limits  URINALYSIS, ROUTINE W REFLEX MICROSCOPIC - Abnormal; Notable for the following:    APPearance CLOUDY (*)    pH 8.5 (*)    All other components within normal limits  CBG MONITORING, ED - Abnormal; Notable for the following:    Glucose-Capillary 55 (*)    All other components within normal limits  PREGNANCY, URINE    EKG  EKG Interpretation None       Radiology No results found.  Procedures Procedures (including critical care time)  Medications Ordered in ED Medications  ibuprofen (ADVIL,MOTRIN) tablet 800 mg (800 mg Oral Given 06/08/16 1606)     Initial Impression / Assessment and Plan / ED Course  I have reviewed the triage vital signs and the nursing notes.  Pertinent labs & imaging results that were available during my care of the patient were reviewed by me and considered in my medical decision making (see chart for details).     However symptoms may be related to her fasting state for almost 16 hours. Was given some juice. We'll check some screening labs, Prevacid test, and urinalysis. She is neurologically intact without concerning symptoms related to her headache. We discussed that her weight may be related to her change in ovarian cycle and hormone cycle.  Final Clinical Impressions(s) / ED Diagnoses   Final diagnoses:  Anemia, unspecified type    New Prescriptions Discharge Medication List as of 06/08/2016  5:15 PM    START taking these medications   Details  ferrous sulfate 325 (65 FE) MG tablet Take 1 tablet (325 mg total) by mouth daily., Starting Fri 06/08/2016, Print         Tanna Furry, MD 06/20/16  1137

## 2016-06-08 NOTE — ED Notes (Signed)
CBG 55. States she has not eaten since MN

## 2016-07-17 ENCOUNTER — Ambulatory Visit: Payer: BLUE CROSS/BLUE SHIELD | Attending: Family Medicine | Admitting: Family Medicine

## 2016-07-17 ENCOUNTER — Encounter: Payer: Self-pay | Admitting: Family Medicine

## 2016-07-17 VITALS — BP 102/66 | HR 58 | Temp 97.9°F | Resp 18 | Ht 66.0 in | Wt 306.8 lb

## 2016-07-17 DIAGNOSIS — Z0000 Encounter for general adult medical examination without abnormal findings: Secondary | ICD-10-CM | POA: Diagnosis not present

## 2016-07-17 DIAGNOSIS — R7303 Prediabetes: Secondary | ICD-10-CM | POA: Insufficient documentation

## 2016-07-17 DIAGNOSIS — L600 Ingrowing nail: Secondary | ICD-10-CM | POA: Insufficient documentation

## 2016-07-17 DIAGNOSIS — Z23 Encounter for immunization: Secondary | ICD-10-CM

## 2016-07-17 DIAGNOSIS — R200 Anesthesia of skin: Secondary | ICD-10-CM

## 2016-07-17 DIAGNOSIS — R202 Paresthesia of skin: Secondary | ICD-10-CM | POA: Insufficient documentation

## 2016-07-17 LAB — POCT GLYCOSYLATED HEMOGLOBIN (HGB A1C): Hemoglobin A1C: 5.9

## 2016-07-17 LAB — GLUCOSE, POCT (MANUAL RESULT ENTRY): POC GLUCOSE: 84 mg/dL (ref 70–99)

## 2016-07-17 MED ORDER — IBUPROFEN 600 MG PO TABS: 600.0000 mg | ORAL_TABLET | Freq: Three times a day (TID) | ORAL | 0 refills | Status: DC | PRN

## 2016-07-17 MED ORDER — METFORMIN HCL ER 500 MG PO TB24: 500.0000 mg | ORAL_TABLET | Freq: Every day | ORAL | 0 refills | Status: DC

## 2016-07-17 NOTE — Progress Notes (Signed)
Subjective:  Patient ID: Michaela Carrillo, female    DOB: 09/27/1985  Age: 31 y.o. MRN: [id number]  CC: Establish Care   HPI Michaela Carrillo presents for   Feet complaints: Two-month history of numbness tingling and cramping in bilateral feet and hands. Reports history of prediabetes. Denies taking any medications at this time. She denies any wounds, vision changes, nausea/vomiting, or abdominal pain. She also complains of painful ingrown toenails to the bilateral left and great toes.   Outpatient Medications Prior to Visit  Medication Sig Dispense Refill  . cyclobenzaprine (FLEXERIL) 10 MG tablet Take 1 tablet (10 mg total) by mouth 2 (two) times daily as needed for muscle spasms. (Patient not taking: Reported on 05/09/2016) 20 tablet 0  . metroNIDAZOLE (FLAGYL) 500 MG tablet Take 1 tablet (500 mg total) by mouth 2 (two) times daily. 14 tablet 0  . pantoprazole (PROTONIX) 20 MG tablet Take 1 tablet (20 mg total) by mouth daily. (Patient not taking: Reported on 05/09/2016) 30 tablet 0  . promethazine (PHENERGAN) 25 MG tablet Take 1 tablet (25 mg total) by mouth every 8 (eight) hours as needed for nausea or vomiting. (Patient not taking: Reported on 03/13/2015) 20 tablet 0  . sucralfate (CARAFATE) 1 g tablet Take 1 tablet (1 g total) by mouth 4 (four) times daily. (Patient not taking: Reported on 05/09/2016) 30 tablet 0  . traMADol (ULTRAM) 50 MG tablet Take 1 tablet (50 mg total) by mouth every 6 (six) hours as needed. (Patient not taking: Reported on 05/09/2016) 15 tablet 0  . ferrous sulfate 325 (65 FE) MG tablet Take 1 tablet (325 mg total) by mouth daily. 30 tablet 0  . ibuprofen (ADVIL,MOTRIN) 800 MG tablet Take 1 tablet (800 mg total) by mouth 3 (three) times daily. (Patient not taking: Reported on 05/09/2016) 21 tablet 0   No facility-administered medications prior to visit.     ROS Review of Systems  Constitutional: Negative.   Eyes: Negative.   Respiratory: Negative.     Cardiovascular: Negative.   Gastrointestinal: Negative.   Endocrine: Negative.   Skin:       Ingrown toenail   Objective:  BP 102/66   Pulse (!) 58   Temp 97.9 F (36.6 C) (Oral)   Resp 18   Ht 5\' 6"  (1.676 m)   Wt (!) 306 lb 12.8 oz (139.2 kg)   SpO2 100%   BMI 49.52 kg/m   BP/Weight 07/17/2016 0/08/2692 11/04/4625  Systolic BP 035 009 381  Diastolic BP 66 61 59  Wt. (Lbs) 306.8 306 306.3  BMI 49.52 52.52 50.97     Physical Exam  Constitutional: She appears well-developed and well-nourished.  Eyes: Conjunctivae are normal. Pupils are equal, round, and reactive to light.  Neck: Normal range of motion. No JVD present.  Cardiovascular: Normal rate, regular rhythm, normal heart sounds and intact distal pulses.   Pulmonary/Chest: Effort normal and breath sounds normal.  Abdominal: Soft. Bowel sounds are normal.  Musculoskeletal: Normal range of motion.  Neurological: No sensory deficit.  Skin: Skin is warm and dry.  Ingrown toenails to bilateral great toes.   Nursing note and vitals reviewed.   Assessment & Plan:   Problem List Items Addressed This Visit    None    Visit Diagnoses    Numbness and tingling of both feet    -  Primary   Relevant Orders   POCT glucose (manual entry) (Completed)   POCT glycosylated hemoglobin (Hb A1C) (Completed)  Pre-diabetes       Relevant Medications   metFORMIN (GLUCOPHAGE-XR) 500 MG 24 hr tablet   Other Relevant Orders   CBC with Differential (Completed)   POCT glucose (manual entry) (Completed)   POCT glycosylated hemoglobin (Hb A1C) (Completed)   Ingrown left big toenail       Relevant Medications   ibuprofen (ADVIL,MOTRIN) 600 MG tablet   Other Relevant Orders   Ambulatory referral to [facility] maintenance       Relevant Orders   Tdap vaccine greater than or equal to 7yo IM (Completed)   Ingrown right big toenail       Relevant Medications   ibuprofen (ADVIL,MOTRIN) 600 MG tablet   Other Relevant Orders    Ambulatory referral to Podiatry      Meds ordered this encounter  Medications  . metFORMIN (GLUCOPHAGE-XR) 500 MG 24 hr tablet    Sig: Take 1 tablet (500 mg total) by mouth daily with breakfast.    Dispense:  90 tablet    Refill:  0    Order Specific Question:   Supervising Provider    Answer:   Tresa Garter [id number]  . ibuprofen (ADVIL,MOTRIN) 600 MG tablet    Sig: Take 1 tablet (600 mg total) by mouth every 8 (eight) hours as needed.    Dispense:  30 tablet    Refill:  0    Order Specific Question:   Supervising Provider    Answer:   Tresa Garter [[id number]]    Follow-up: Return in about 3 months (around 10/16/2016) for Pre-Diabetes.   Alfonse Spruce FNP

## 2016-07-17 NOTE — Patient Instructions (Addendum)
Prediabetes Eating Plan Prediabetes-also called impaired glucose tolerance or impaired fasting glucose-is a condition that causes blood sugar (blood glucose) levels to be higher than normal. Following a healthy diet can help to keep prediabetes under control. It can also help to lower the risk of type 2 diabetes and heart disease, which are increased in people who have prediabetes. Along with regular exercise, a healthy diet:  Promotes weight loss.  Helps to control blood sugar levels.  Helps to improve the way that the body uses insulin. What do I need to know about this eating plan?  Use the glycemic index (GI) to plan your meals. The index tells you how quickly a food will raise your blood sugar. Choose low-GI foods. These foods take a longer time to raise blood sugar.  Pay close attention to the amount of carbohydrates in the food that you eat. Carbohydrates increase blood sugar levels.  Keep track of how many calories you take in. Eating the right amount of calories will help you to achieve a healthy weight. Losing about 7 percent of your starting weight can help to prevent type 2 diabetes.  You may want to follow a Mediterranean diet. This diet includes a lot of vegetables, lean meats or fish, whole grains, fruits, and healthy oils and fats. What foods can I eat? Grains  Whole grains, such as whole-wheat or whole-grain breads, crackers, cereals, and pasta. Unsweetened oatmeal. Bulgur. Barley. Quinoa. Brown rice. Corn or whole-wheat flour tortillas or taco shells. Vegetables  Lettuce. Spinach. Peas. Beets. Cauliflower. Cabbage. Broccoli. Carrots. Tomatoes. Squash. Eggplant. Herbs. Peppers. Onions. Cucumbers. Brussels sprouts. Fruits  Berries. Bananas. Apples. Oranges. Grapes. Papaya. Mango. Pomegranate. Kiwi. Grapefruit. Cherries. Meats and Other Protein Sources  Seafood. Lean meats, such as chicken and [place] or lean cuts of pork and beef. Tofu. Eggs. Nuts. Beans. Dairy  Low-fat  or fat-free dairy products, such as yogurt, cottage cheese, and cheese. Beverages  Water. Tea. Coffee. Sugar-free or diet soda. Seltzer water. Milk. Milk alternatives, such as soy or almond milk. Condiments  Mustard. Relish. Low-fat, low-sugar ketchup. Low-fat, low-sugar barbecue sauce. Low-fat or fat-free mayonnaise. Sweets and Desserts  Sugar-free or low-fat pudding. Sugar-free or low-fat ice cream and other frozen treats. Fats and Oils  Avocado. Walnuts. Olive oil. The items listed above may not be a complete list of recommended foods or beverages. Contact your dietitian for more options.  What foods are not recommended? Grains  Refined white flour and flour products, such as bread, pasta, snack foods, and cereals. Beverages  Sweetened drinks, such as sweet iced tea and soda. Sweets and Desserts  Baked goods, such as cake, cupcakes, pastries, cookies, and cheesecake. The items listed above may not be a complete list of foods and beverages to avoid. Contact your dietitian for more information.  This information is not intended to replace advice given to you by your health care provider. Make sure you discuss any questions you have with your health care provider. Document Released: 08/03/2014 Document Revised: 08/25/2015 Document Reviewed: 04/14/2014 Elsevier Interactive Patient Education  2017 [place] An ingrown toenail occurs when the corner or sides of your toenail grow into the surrounding skin. The big toe is most commonly affected, but it can happen to any of your toes. If your ingrown toenail is not treated, you will be at risk for infection. What are the causes? This condition may be caused by:  Wearing shoes that are too small or tight.  Injury or trauma, such  as stubbing your toe or having your toe stepped on.  Improper cutting or care of your toenails.  Being born with (congenital) nail or foot abnormalities, such as having a nail that is too big  for your toe. What increases the risk? Risk factors for an ingrown toenail include:  Age. Your nails tend to thicken as you get older, so ingrown nails are more common in older people.  Diabetes.  Cutting your toenails incorrectly.  Blood circulation problems. What are the signs or symptoms? Symptoms may include:  Pain, soreness, or tenderness.  Redness.  Swelling.  Hardening of the skin surrounding the toe. Your ingrown toenail may be infected if there is fluid, pus, or drainage. How is this diagnosed? An ingrown toenail may be diagnosed by medical history and physical exam. If your toenail is infected, your health care provider may test a sample of the drainage. How is this treated? Treatment depends on the severity of your ingrown toenail. Some ingrown toenails may be treated at home. More severe or infected ingrown toenails may require surgery to remove all or part of the nail. Infected ingrown toenails may also be treated with antibiotic medicines. Follow these instructions at home:  If you were prescribed an antibiotic medicine, finish all of it even if you start to feel better.  Soak your foot in warm soapy water for 20 minutes, 3 times per day or as directed by your health care provider.  Carefully lift the edge of the nail away from the sore skin by wedging a small piece of cotton under the corner of the nail. This may help with the pain. Be careful not to cause more injury to the area.  Wear shoes that fit well. If your ingrown toenail is causing you pain, try wearing sandals, if possible.  Trim your toenails regularly and carefully. Do not cut them in a curved shape. Cut your toenails straight across. This prevents injury to the skin at the corners of the toenail.  Keep your feet clean and dry.  If you are having trouble walking and are given crutches by your health care provider, use them as directed.  Do not pick at your toenail or try to remove it  yourself.  Take medicines only as directed by your health care provider.  Keep all follow-up visits as directed by your health care provider. This is important. Contact a health care provider if:  Your symptoms do not improve with treatment. Get help right away if:  You have red streaks that start at your foot and go up your leg.  You have a fever.  You have increased redness, swelling, or pain.  You have fluid, blood, or pus coming from your toenail. This information is not intended to replace advice given to you by your health care provider. Make sure you discuss any questions you have with your health care provider. Document Released: 03/16/2000 Document Revised: 08/19/2015 Document Reviewed: 02/10/2014 Elsevier Interactive Patient Education  2017 [place]. Metformin extended-release tablets What is this medicine? METFORMIN (met FOR min) is used to treat type 2 diabetes. It helps to control blood sugar. Treatment is combined with diet and exercise. This medicine can be used alone or with other medicines for diabetes. This medicine may be used for other purposes; ask your health care provider or pharmacist if you have questions. COMMON BRAND NAME(S): Fortamet, Glucophage XR, Glumetza What should I tell my health care provider before I take this medicine? They need to know if you  have any of these conditions: -anemia -dehydration -heart disease -frequently drink alcohol-containing beverages -kidney disease -liver disease -polycystic ovary syndrome -serious infection or injury -vomiting -an unusual or allergic reaction to metformin, other medicines, foods, dyes, or preservatives -pregnant or trying to get pregnant -breast-feeding How should I use this medicine? Take this medicine by mouth with a glass of water. Follow the directions on the prescription label. Take this medicine with food. Take your medicine at regular intervals. Do not take your medicine more often than  directed. Do not stop taking except on your doctor's advice. Talk to your pediatrician regarding the use of this medicine in children. Special care may be needed. Overdosage: If you think you have taken too much of this medicine contact a poison control center or emergency room at once. NOTE: This medicine is only for you. Do not share this medicine with others. What if I miss a dose? If you miss a dose, take it as soon as you can. If it is almost time for your next dose, take only that dose. Do not take double or extra doses. What may interact with this medicine? Do not take this medicine with any of the following medications: -dofetilide -certain contrast medicines given before X-rays, CT scans, MRI, or other procedures This medicine may also interact with the following medications: -acetazolamide -certain antiviral medicines for HIV or AIDS or for hepatitis, like adefovir, dolutegravir, emtricitabine, entecavir, lamivudine, paritaprevir, or tenofovir -cimetidine -cobicistat -crizotinib -dichlorphenamide -digoxin -diuretics -female hormones, like estrogens or progestins and birth control pills -glycopyrrolate -isoniazid -lamotrigine -medicines for blood pressure, heart disease, irregular heart beat -memantine -midodrine -methazolamide -morphine -niacin -phenothiazines like chlorpromazine, mesoridazine, prochlorperazine, thioridazine -phenytoin -procainamide -propantheline -quinidine -quinine -ranitidine -ranolazine -steroid medicines like prednisone or cortisone -stimulant medicines for attention disorders, weight loss, or to stay awake -thyroid medicines -topiramate -trimethoprim -trospium -vancomycin -vandetanib -zonisamide This list may not describe all possible interactions. Give your health care provider a list of all the medicines, herbs, non-prescription drugs, or dietary supplements you use. Also tell them if you smoke, drink alcohol, or use illegal drugs. Some  items may interact with your medicine. What should I watch for while using this medicine? Visit your doctor or health care professional for regular checks on your progress. A test called the HbA1C (A1C) will be monitored. This is a simple blood test. It measures your blood sugar control over the last 2 to 3 months. You will receive this test every 3 to 6 months. Learn how to check your blood sugar. Learn the symptoms of low and high blood sugar and how to manage them. Always carry a quick-source of sugar with you in case you have symptoms of low blood sugar. Examples include hard sugar candy or glucose tablets. Make sure others know that you can choke if you eat or drink when you develop serious symptoms of low blood sugar, such as seizures or unconsciousness. They must get medical help at once. Tell your doctor or health care professional if you have high blood sugar. You might need to change the dose of your medicine. If you are sick or exercising more than usual, you might need to change the dose of your medicine. Do not skip meals. Ask your doctor or health care professional if you should avoid alcohol. Many nonprescription cough and cold products contain sugar or alcohol. These can affect blood sugar. This medicine may cause ovulation in premenopausal women who do not have regular monthly periods. This may increase your chances of  becoming pregnant. You should not take this medicine if you become pregnant or think you may be pregnant. Talk with your doctor or health care professional about your birth control options while taking this medicine. Contact your doctor or health care professional right away if think you are pregnant. The tablet shell for some brands of this medicine does not dissolve. This is normal. The tablet shell may appear whole in the stool. This is not a cause for concern. If you are going to need surgery, a MRI, CT scan, or other procedure, tell your doctor that you are taking this  medicine. You may need to stop taking this medicine before the procedure. Wear a medical ID bracelet or chain, and carry a card that describes your disease and details of your medicine and dosage times. What side effects may I notice from receiving this medicine? Side effects that you should report to your doctor or health care professional as soon as possible: -allergic reactions like skin rash, itching or hives, swelling of the face, lips, or tongue -breathing problems -feeling faint or lightheaded, falls -muscle aches or pains -signs and symptoms of low blood sugar such as feeling anxious, confusion, dizziness, increased hunger, unusually weak or tired, sweating, shakiness, cold, irritable, headache, blurred vision, fast heartbeat, loss of consciousness -slow or irregular heartbeat -unusual stomach pain or discomfort -unusually tired or weak Side effects that usually do not require medical attention (report to your doctor or health care professional if they continue or are bothersome): -diarrhea -headache -heartburn -metallic taste in mouth -nausea -stomach gas, upset This list may not describe all possible side effects. Call your doctor for medical advice about side effects. You may report side effects to FDA at 1-800-FDA-1088. Where should I keep my medicine? Keep out of the reach of children. Store at room temperature between 15 and 30 degrees C (59 and 86 degrees F). Protect from light. Throw away any unused medicine after the expiration date. NOTE: This sheet is a summary. It may not cover all possible information. If you have questions about this medicine, talk to your doctor, pharmacist, or health care provider.  2018 Elsevier/Gold Standard (2015-09-28 15:47:35)

## 2016-07-17 NOTE — Progress Notes (Signed)
Patient is here for numbness, tightness cramps in feet also notice it that it has been swollen    Patient also need blood work for glucose & A1c  Patient stated that both of her big toe nail hurt maybe she has an ingrown toe nail

## 2016-07-18 LAB — CBC WITH DIFFERENTIAL/PLATELET
BASOS ABS: 0 10*3/uL (ref 0.0–0.2)
Basos: 0 %
EOS (ABSOLUTE): 0.2 10*3/uL (ref 0.0–0.4)
Eos: 2 %
Hematocrit: 34.9 % (ref 34.0–46.6)
Hemoglobin: 9.6 g/dL — ABNORMAL LOW (ref 11.1–15.9)
IMMATURE GRANS (ABS): 0 10*3/uL (ref 0.0–0.1)
Immature Granulocytes: 0 %
LYMPHS: 16 %
Lymphocytes Absolute: 1.8 10*3/uL (ref 0.7–3.1)
MCH: 18.9 pg — AB (ref 26.6–33.0)
MCHC: 27.5 g/dL — AB (ref 31.5–35.7)
MCV: 69 fL — ABNORMAL LOW (ref 79–97)
Monocytes Absolute: 0.6 10*3/uL (ref 0.1–0.9)
Monocytes: 5 %
NEUTROS ABS: 8.7 10*3/uL — AB (ref 1.4–7.0)
Neutrophils: 77 %
PLATELETS: 478 10*3/uL — AB (ref 150–379)
RBC: 5.09 x10E6/uL (ref 3.77–5.28)
RDW: 19.6 % — ABNORMAL HIGH (ref 12.3–15.4)
WBC: 11.5 10*3/uL — ABNORMAL HIGH (ref 3.4–10.8)

## 2016-07-23 ENCOUNTER — Other Ambulatory Visit: Payer: Self-pay | Admitting: Family Medicine

## 2016-07-23 ENCOUNTER — Telehealth: Payer: Self-pay

## 2016-07-23 DIAGNOSIS — D509 Iron deficiency anemia, unspecified: Secondary | ICD-10-CM

## 2016-07-23 MED ORDER — FERROUS SULFATE 325 (65 FE) MG PO TABS: 325.0000 mg | ORAL_TABLET | Freq: Three times a day (TID) | ORAL | 3 refills | Status: DC

## 2016-07-23 NOTE — Telephone Encounter (Signed)
CMA call patient to let them know about lab results  Patient Verify DOB  Patient was aware and understood

## 2016-07-23 NOTE — Progress Notes (Unsigned)
   Subjective:  Patient ID: Michaela Carrillo, female    DOB: 09-Aug-1985  Age: 31 y.o. MRN: [id number]  CC: No chief complaint on file.   HPI Michaela Carrillo presents for ***  Outpatient Medications Prior to Visit  Medication Sig Dispense Refill  . cyclobenzaprine (FLEXERIL) 10 MG tablet Take 1 tablet (10 mg total) by mouth 2 (two) times daily as needed for muscle spasms. (Patient not taking: Reported on 05/09/2016) 20 tablet 0  . ibuprofen (ADVIL,MOTRIN) 600 MG tablet Take 1 tablet (600 mg total) by mouth every 8 (eight) hours as needed. 30 tablet 0  . metFORMIN (GLUCOPHAGE-XR) 500 MG 24 hr tablet Take 1 tablet (500 mg total) by mouth daily with breakfast. 90 tablet 0  . metroNIDAZOLE (FLAGYL) 500 MG tablet Take 1 tablet (500 mg total) by mouth 2 (two) times daily. 14 tablet 0  . pantoprazole (PROTONIX) 20 MG tablet Take 1 tablet (20 mg total) by mouth daily. (Patient not taking: Reported on 05/09/2016) 30 tablet 0  . promethazine (PHENERGAN) 25 MG tablet Take 1 tablet (25 mg total) by mouth every 8 (eight) hours as needed for nausea or vomiting. (Patient not taking: Reported on 03/13/2015) 20 tablet 0  . sucralfate (CARAFATE) 1 g tablet Take 1 tablet (1 g total) by mouth 4 (four) times daily. (Patient not taking: Reported on 05/09/2016) 30 tablet 0  . traMADol (ULTRAM) 50 MG tablet Take 1 tablet (50 mg total) by mouth every 6 (six) hours as needed. (Patient not taking: Reported on 05/09/2016) 15 tablet 0  . ferrous sulfate 325 (65 FE) MG tablet Take 1 tablet (325 mg total) by mouth daily. 30 tablet 0   No facility-administered medications prior to visit.     ROS Review of Systems  Review of Systems - {ros [id number]}    Objective:  There were no vitals taken for this visit.  BP/Weight 07/17/2016 10/31/1029 08/09/4583  Systolic BP 929 244 628  Diastolic BP 66 61 59  Wt. (Lbs) 306.8 306 306.3  BMI 49.52 52.52 50.97     Physical Exam   Assessment & Plan:   Problem List  Items Addressed This Visit    None    Visit Diagnoses    Iron deficiency anemia, unspecified iron deficiency anemia type    -  Primary   Relevant Medications   ferrous sulfate 325 (65 FE) MG tablet   Other Relevant Orders   CBC with Differential   CBC with Differential   Fe+TIBC+Fer      Meds ordered this encounter  Medications  . ferrous sulfate 325 (65 FE) MG tablet    Sig: Take 1 tablet (325 mg total) by mouth 3 (three) times daily before meals.    Dispense:  90 tablet    Refill:  3    Order Specific Question:   Supervising Provider    Answer:   Tresa Garter [id number]    Follow-up: No Follow-up on file.   [name]

## 2016-07-23 NOTE — Telephone Encounter (Signed)
-----   Message from Alfonse Spruce, [place] sent at 07/23/2016  9:45 AM EDT ----- CBC indicates iron deficiency anemia. Recommend increasing iron supplement to three times a day. Can take on an empty stomach with small amount of orange juice for better absorption. Recommend follow up lab in 1 month and again in 3 months.  Increase your dietary iron intake. Good sources of iron include dark green leafy vegetables, meats, beans, and iron fortified cereals.

## 2016-08-10 ENCOUNTER — Encounter: Payer: Self-pay | Admitting: Podiatry

## 2016-08-10 ENCOUNTER — Ambulatory Visit (INDEPENDENT_AMBULATORY_CARE_PROVIDER_SITE_OTHER): Payer: BLUE CROSS/BLUE SHIELD | Admitting: Podiatry

## 2016-08-10 VITALS — BP 117/54 | HR 67 | Resp 16 | Ht 64.0 in | Wt 300.0 lb

## 2016-08-10 DIAGNOSIS — L600 Ingrowing nail: Secondary | ICD-10-CM

## 2016-08-10 NOTE — Patient Instructions (Signed)

## 2016-08-10 NOTE — Progress Notes (Signed)
   Subjective:    Patient ID: Michaela Carrillo, female    DOB: April 07, 1985, 31 y.o.   MRN: [id number]  HPI Chief Complaint  Patient presents with  . Nail Problem    Bilateral; great toes-medial; pt stated, "Toes have been hurting for years"      Review of Systems  All other systems reviewed and are negative.      Objective:   Physical Exam        Assessment & Plan:

## 2016-08-12 NOTE — Progress Notes (Signed)
Subjective:    Patient ID: Michaela Carrillo, female   DOB: 31 y.o.   MRN: [id number]   HPI patient presents stating that both her big toenails are ingrown and it's been going on for a long time and she's been to pedicurist and tried to trim them and soak them without relief anymore    Review of Systems  All other systems reviewed and are negative.       Objective:  Physical Exam  Cardiovascular: Intact distal pulses.   Musculoskeletal: Normal range of motion.  Neurological: She is alert.  Skin: Skin is warm.  Nursing note and vitals reviewed.  neurovascular status intact muscle strength adequate range of motion within normal limits with patient found to have incurvated hallux nails medial border bilateral that are painful when pressed and makes shoe gear difficult. 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 Explained procedure and risk and today I infiltrated each hallux 60 mg Xylocaine Marcaine mixture removed the medial borders and exposed matrix and applied phenol 3 applications 30 seconds to each border. Gave instructions on soaks and reappoint

## 2016-08-17 ENCOUNTER — Other Ambulatory Visit: Payer: Self-pay | Admitting: Podiatry

## 2016-08-17 MED ORDER — CEPHALEXIN 500 MG PO CAPS: 500.0000 mg | ORAL_CAPSULE | Freq: Three times a day (TID) | ORAL | 0 refills | Status: DC

## 2016-08-17 NOTE — Progress Notes (Signed)
Patient called tonight stating that she was having pain from ingrown toenail procedure since she is concerned that they are not healing right. She has been having some drainage coming from the area she states the area has been more tender over the last 2 days. She said that she had a leave work early last night because of the pain she is not going to work tonight because of the pain. She's been taking ibuprofen which is been helping. She relates a small amount of redness around the site but denies any red streaks. She has no other complaints  I sent Keflex to her pharmacy and recommended neosporin with lidocaine to the toe. If symptoms worsen to call back or go to the ER.   I offered tramadol but she states she is not worried about pain medication  Recommend follow-up next week. Will have the schedulers call her Monday morning.

## 2016-08-20 ENCOUNTER — Encounter: Payer: Self-pay | Admitting: Podiatry

## 2016-08-20 ENCOUNTER — Ambulatory Visit (INDEPENDENT_AMBULATORY_CARE_PROVIDER_SITE_OTHER): Payer: Self-pay | Admitting: Podiatry

## 2016-08-20 DIAGNOSIS — L03031 Cellulitis of right toe: Secondary | ICD-10-CM

## 2016-08-22 NOTE — Progress Notes (Signed)
Subjective:    Patient ID: Michaela Carrillo, female   DOB: 31 y.o.   MRN: [id number]   HPI patient was concerned because she still having pain in the big toes of both feet and she's noted some localized drainage    ROS      Objective:  Physical Exam Neurovascular found to be intact with patient found to have localized drainage of the hallux nails medial border where ingrown's were removed. There is no proximal edema erythema or drainage noted and no systemic signs of infection    Assessment:     Localized paronychia infection hallux bilateral    Plan:    I advised on the importance of continuing to soak and keeping the bandage during the day with air dry at night. As precautionary measure placed on cephalexin 500 mg 3 times a day and gave strict instructions of any further redness drainage or any systemic signs of infection were to occur to let us know immediately and to go to the emergency room if there is any systemic signs of infection

## 2016-10-14 ENCOUNTER — Other Ambulatory Visit: Payer: Self-pay | Admitting: Family Medicine

## 2016-10-14 DIAGNOSIS — R7303 Prediabetes: Secondary | ICD-10-CM

## 2016-11-24 ENCOUNTER — Encounter (HOSPITAL_COMMUNITY): Payer: Self-pay

## 2016-11-24 DIAGNOSIS — Z7984 Long term (current) use of oral hypoglycemic drugs: Secondary | ICD-10-CM | POA: Diagnosis not present

## 2016-11-24 DIAGNOSIS — Y92410 Unspecified street and highway as the place of occurrence of the external cause: Secondary | ICD-10-CM | POA: Insufficient documentation

## 2016-11-24 DIAGNOSIS — M545 Low back pain: Secondary | ICD-10-CM | POA: Diagnosis present

## 2016-11-24 DIAGNOSIS — Y999 Unspecified external cause status: Secondary | ICD-10-CM | POA: Diagnosis not present

## 2016-11-24 DIAGNOSIS — Y9389 Activity, other specified: Secondary | ICD-10-CM | POA: Diagnosis not present

## 2016-11-24 DIAGNOSIS — Z79899 Other long term (current) drug therapy: Secondary | ICD-10-CM | POA: Insufficient documentation
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
## 2016-11-25 ENCOUNTER — Emergency Department (HOSPITAL_COMMUNITY)
Admission: EM | Admit: 2016-11-25 | Discharge: 2016-11-25 | Disposition: A | Discharge status: Home / Self Care | Payer: BLUE CROSS/BLUE SHIELD | Attending: Emergency Medicine | Admitting: Emergency Medicine

## 2016-11-25 DIAGNOSIS — M545 Low back pain, unspecified: Secondary | ICD-10-CM

## 2016-11-25 MED ORDER — CYCLOBENZAPRINE HCL 10 MG PO TABS: 10.0000 mg | ORAL_TABLET | Freq: Two times a day (BID) | ORAL | 0 refills | Status: DC | PRN

## 2016-11-25 MED ORDER — CYCLOBENZAPRINE HCL 10 MG PO TABS
5.0000 mg | ORAL_TABLET | Freq: Once | ORAL | Status: AC
  Administered 2016-11-25: 5 mg via ORAL
  Filled 2016-11-25: qty 1

## 2016-11-25 MED ORDER — IBUPROFEN 400 MG PO TABS: 400.0000 mg | ORAL_TABLET | Freq: Four times a day (QID) | ORAL | 0 refills | Status: DC | PRN

## 2016-11-25 MED ORDER — KETOROLAC TROMETHAMINE 60 MG/2ML IM SOLN
60.0000 mg | Freq: Once | INTRAMUSCULAR | Status: AC
  Administered 2016-11-25: 60 mg via INTRAMUSCULAR
  Filled 2016-11-25: qty 2
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
## 2016-11-28 ENCOUNTER — Encounter (HOSPITAL_COMMUNITY): Payer: Self-pay | Admitting: Emergency Medicine

## 2016-11-28 DIAGNOSIS — D649 Anemia, unspecified: Secondary | ICD-10-CM | POA: Diagnosis not present

## 2016-11-28 DIAGNOSIS — Y9389 Activity, other specified: Secondary | ICD-10-CM | POA: Insufficient documentation

## 2016-11-28 DIAGNOSIS — Z79899 Other long term (current) drug therapy: Secondary | ICD-10-CM | POA: Diagnosis not present

## 2016-11-28 DIAGNOSIS — S3992XA Unspecified injury of lower back, initial encounter: Secondary | ICD-10-CM | POA: Diagnosis present

## 2016-11-28 DIAGNOSIS — S39012A Strain of muscle, fascia and tendon of lower back, initial encounter: Secondary | ICD-10-CM | POA: Insufficient documentation

## 2016-11-28 DIAGNOSIS — Y929 Unspecified place or not applicable: Secondary | ICD-10-CM | POA: Insufficient documentation

## 2016-11-28 DIAGNOSIS — Y999 Unspecified external cause status: Secondary | ICD-10-CM | POA: Diagnosis not present
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
## 2016-11-29 ENCOUNTER — Emergency Department (HOSPITAL_COMMUNITY)
Admission: EM | Admit: 2016-11-29 | Discharge: 2016-11-29 | Disposition: A | Discharge status: Home / Self Care | Payer: BLUE CROSS/BLUE SHIELD | Attending: Emergency Medicine | Admitting: Emergency Medicine

## 2016-11-29 ENCOUNTER — Emergency Department (HOSPITAL_COMMUNITY): Payer: BLUE CROSS/BLUE SHIELD

## 2016-11-29 DIAGNOSIS — M545 Low back pain, unspecified: Secondary | ICD-10-CM

## 2016-11-29 DIAGNOSIS — S39012A Strain of muscle, fascia and tendon of lower back, initial encounter: Secondary | ICD-10-CM

## 2016-11-29 LAB — POC URINE PREG, ED: PREG TEST UR: NEGATIVE

## 2016-11-29 MED ORDER — METHOCARBAMOL 500 MG PO TABS: 500.0000 mg | ORAL_TABLET | Freq: Two times a day (BID) | ORAL | 0 refills | Status: DC

## 2016-11-29 MED ORDER — IBUPROFEN 800 MG PO TABS
800.0000 mg | ORAL_TABLET | Freq: Once | ORAL | Status: AC
  Administered 2016-11-29: 800 mg via ORAL
  Filled 2016-11-29: qty 1
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
## 2016-11-29 NOTE — Discharge Instructions (Signed)

## 2017-01-03 ENCOUNTER — Ambulatory Visit: Payer: BLUE CROSS/BLUE SHIELD | Attending: Family Medicine | Admitting: Family Medicine

## 2017-01-03 ENCOUNTER — Encounter: Payer: Self-pay | Admitting: Family Medicine

## 2017-01-03 ENCOUNTER — Other Ambulatory Visit (HOSPITAL_COMMUNITY)
Admission: RE | Admit: 2017-01-03 | Discharge: 2017-01-03 | Disposition: A | Discharge status: Home / Self Care | Payer: BLUE CROSS/BLUE SHIELD | Source: Ambulatory Visit | Attending: Family Medicine | Admitting: Family Medicine

## 2017-01-03 VITALS — BP 104/67 | HR 81 | Temp 98.8°F | Resp 18 | Ht 60.0 in | Wt 294.0 lb

## 2017-01-03 DIAGNOSIS — R300 Dysuria: Secondary | ICD-10-CM | POA: Insufficient documentation

## 2017-01-03 DIAGNOSIS — Z6841 Body Mass Index (BMI) 40.0 and over, adult: Secondary | ICD-10-CM | POA: Insufficient documentation

## 2017-01-03 DIAGNOSIS — N898 Other specified noninflammatory disorders of vagina: Secondary | ICD-10-CM | POA: Insufficient documentation

## 2017-01-03 DIAGNOSIS — Z23 Encounter for immunization: Secondary | ICD-10-CM | POA: Insufficient documentation

## 2017-01-03 DIAGNOSIS — F4323 Adjustment disorder with mixed anxiety and depressed mood: Secondary | ICD-10-CM | POA: Diagnosis not present

## 2017-01-03 DIAGNOSIS — R3129 Other microscopic hematuria: Secondary | ICD-10-CM | POA: Diagnosis not present

## 2017-01-03 DIAGNOSIS — R7303 Prediabetes: Secondary | ICD-10-CM

## 2017-01-03 DIAGNOSIS — M545 Low back pain, unspecified: Secondary | ICD-10-CM

## 2017-01-03 LAB — POCT URINALYSIS DIPSTICK
Bilirubin, UA: NEGATIVE
GLUCOSE UA: NEGATIVE
KETONES UA: NEGATIVE
Leukocytes, UA: NEGATIVE
Nitrite, UA: NEGATIVE
Protein, UA: NEGATIVE
UROBILINOGEN UA: 1 U/dL
pH, UA: 6 (ref 5.0–8.0)

## 2017-01-03 LAB — POCT GLYCOSYLATED HEMOGLOBIN (HGB A1C): Hemoglobin A1C: 5.8

## 2017-01-03 LAB — POCT URINE PREGNANCY: PREG TEST UR: NEGATIVE

## 2017-01-03 MED ORDER — IBUPROFEN 400 MG PO TABS: 400.0000 mg | ORAL_TABLET | Freq: Four times a day (QID) | ORAL | 0 refills | Status: DC | PRN

## 2017-01-03 MED ORDER — METFORMIN HCL ER 500 MG PO TB24: 500.0000 mg | ORAL_TABLET | Freq: Two times a day (BID) | ORAL | 3 refills | Status: DC

## 2017-01-03 MED ORDER — METHOCARBAMOL 500 MG PO TABS: 500.0000 mg | ORAL_TABLET | Freq: Two times a day (BID) | ORAL | 0 refills | Status: DC

## 2017-01-03 NOTE — Progress Notes (Signed)
Patient is here for lower back pain frequent urination

## 2017-01-03 NOTE — Patient Instructions (Signed)
Prediabetes Eating Plan Prediabetes-also called impaired glucose tolerance or impaired fasting glucose-is a condition that causes blood sugar (blood glucose) levels to be higher than normal. Following a healthy diet can help to keep prediabetes under control. It can also help to lower the risk of type 2 diabetes and heart disease, which are increased in people who have prediabetes. Along with regular exercise, a healthy diet:  Promotes weight loss.  Helps to control blood sugar levels.  Helps to improve the way that the body uses insulin.  What do I need to know about this eating plan?  Use the glycemic index (GI) to plan your meals. The index tells you how quickly a food will raise your blood sugar. Choose low-GI foods. These foods take a longer time to raise blood sugar.  Pay close attention to the amount of carbohydrates in the food that you eat. Carbohydrates increase blood sugar levels.  Keep track of how many calories you take in. Eating the right amount of calories will help you to achieve a healthy weight. Losing about 7 percent of your starting weight can help to prevent type 2 diabetes.  You may want to follow a Mediterranean diet. This diet includes a lot of vegetables, lean meats or fish, whole grains, fruits, and healthy oils and fats. What foods can I eat? Grains Whole grains, such as whole-wheat or whole-grain breads, crackers, cereals, and pasta. Unsweetened oatmeal. Bulgur. Barley. Quinoa. Brown rice. Corn or whole-wheat flour tortillas or taco shells. Vegetables Lettuce. Spinach. Peas. Beets. Cauliflower. Cabbage. Broccoli. Carrots. Tomatoes. Squash. Eggplant. Herbs. Peppers. Onions. Cucumbers. Brussels sprouts. Fruits Berries. Bananas. Apples. Oranges. Grapes. Papaya. Mango. Pomegranate. Kiwi. Grapefruit. Cherries. Meats and Other Protein Sources Seafood. Lean meats, such as chicken and turkey or lean cuts of pork and beef. Tofu. Eggs. Nuts. Beans. Dairy Low-fat or  fat-free dairy products, such as yogurt, cottage cheese, and cheese. Beverages Water. Tea. Coffee. Sugar-free or diet soda. Seltzer water. Milk. Milk alternatives, such as soy or almond milk. Condiments Mustard. Relish. Low-fat, low-sugar ketchup. Low-fat, low-sugar barbecue sauce. Low-fat or fat-free mayonnaise. Sweets and Desserts Sugar-free or low-fat pudding. Sugar-free or low-fat ice cream and other frozen treats. Fats and Oils Avocado. Walnuts. Olive oil. The items listed above may not be a complete list of recommended foods or beverages. Contact your dietitian for more options. What foods are not recommended? Grains Refined white flour and flour products, such as bread, pasta, snack foods, and cereals. Beverages Sweetened drinks, such as sweet iced tea and soda. Sweets and Desserts Baked goods, such as cake, cupcakes, pastries, cookies, and cheesecake. The items listed above may not be a complete list of foods and beverages to avoid. Contact your dietitian for more information. This information is not intended to replace advice given to you by your health care provider. Make sure you discuss any questions you have with your health care provider. Document Released: 08/03/2014 Document Revised: 08/25/2015 Document Reviewed: 04/14/2014 Elsevier Interactive Patient Education  2017 Elsevier Inc.  

## 2017-01-03 NOTE — Progress Notes (Signed)
Subjective:  Patient ID: Michaela Carrillo, female    DOB: 02/17/1986  Age: 31 y.o. MRN: [id number]  CC: Establish Care   HPI Michaela CHARLOT presents to establish care. She complaints of dysuria symptoms. Onset a few weeks ago. Symptoms include  foul smelling urine, frequency and vaginal discharge.  She reports discharge is thin, clear and malodorous. She report back pain. Patient also complains of back pain. Patient denies fever and hematuria. Patient does not have a history of recurrent UTI.  Patient does not have a history of pyelonephritis. She is agreeable to STI screening. She reports one partner within the last 3 months. She reports feeling of anxious and depressed moods. She reports recent stressors. She denies any SI/HI. She declines referral or medication.   Outpatient Medications Prior to Visit  Medication Sig Dispense Refill  . cephALEXin (KEFLEX) 500 MG capsule Take 1 capsule (500 mg total) by mouth 3 (three) times daily. 30 capsule 0  . cyclobenzaprine (FLEXERIL) 10 MG tablet Take 1 tablet (10 mg total) by mouth 2 (two) times daily as needed for muscle spasms. 20 tablet 0  . ibuprofen (ADVIL,MOTRIN) 400 MG tablet Take 1 tablet (400 mg total) by mouth every 6 (six) hours as needed. 30 tablet 0  . metFORMIN (GLUCOPHAGE-XR) 500 MG 24 hr tablet TAKE 1 TABLET BY MOUTH DAILY WITH BREAKFAST. 30 tablet 0  . methocarbamol (ROBAXIN) 500 MG tablet Take 1 tablet (500 mg total) by mouth 2 (two) times daily. 20 tablet 0   No facility-administered medications prior to visit.     ROS Review of Systems  Constitutional: Negative.   Respiratory: Negative.   Cardiovascular: Negative.   Gastrointestinal: Negative.   Genitourinary: Positive for dysuria and vaginal discharge.  Skin: Negative.    Objective:  BP 104/67 (BP Location: Left Arm, Patient Position: Sitting, Cuff Size: Normal)   Pulse 81   Temp 98.8 F (37.1 C) (Oral)   Resp 18   Ht 5' (1.524 m)   Wt 294 lb (133.4 kg)    SpO2 95%   BMI 57.42 kg/m   BP/Weight 01/03/2017 11/29/2016 2/75/1700  Systolic BP 174 944 967  Diastolic BP 67 71 59  Wt. (Lbs) 294 - -  BMI 57.42 - 51.01     Physical Exam  Constitutional: She appears well-developed and well-nourished.  Eyes: Pupils are equal, round, and reactive to light. Conjunctivae are normal.  Cardiovascular: Normal rate, regular rhythm, normal heart sounds and intact distal pulses.   Pulmonary/Chest: Effort normal and breath sounds normal.  Abdominal: Soft. Bowel sounds are normal. There is no tenderness. There is no CVA tenderness.  Genitourinary:  Genitourinary Comments: urine cyto screen  Musculoskeletal:       Lumbar back: She exhibits pain (right sided back pain).  Skin: Skin is warm and dry.  Nursing note and vitals reviewed.    Assessment & Plan:   1. Dysuria  - Urinalysis Dipstick - Urine cytology ancillary only  2. Morbid obesity with BMI of 50.0-59.9, adult (HCC)  - TSH - Amb ref to Medical Nutrition Therapy-MNT  3. Acute right-sided low back pain without sciatica  - DG Abd 1 View; Future - methocarbamol (ROBAXIN) 500 MG tablet; Take 1 tablet (500 mg total) by mouth 2 (two) times daily.  Dispense: 20 tablet; Refill: 0 - ibuprofen (ADVIL,MOTRIN) 400 MG tablet; Take 1 tablet (400 mg total) by mouth every 6 (six) hours as needed (Take with food.).  Dispense: 30 tablet; Refill: 0  4. Vaginal  discharge  - Urine cytology ancillary only - POCT urine pregnancy  5. Adjustment disorder with mixed anxiety and depressed mood Provided with LCSW contact information to follow up as need.  6. Hematuria, microscopic  - DG Abd 1 View; Future  7. Prediabetes  - HgB A1c - metFORMIN (GLUCOPHAGE-XR) 500 MG 24 hr tablet; Take 1 tablet (500 mg total) by mouth 2 (two) times daily with a meal.  Dispense: 60 tablet; Refill: 3  8. Needs flu shot  - Flu Vaccine QUAD 6+ mos PF IM (Fluarix Quad PF)    Meds ordered this encounter  Medications    . metFORMIN (GLUCOPHAGE-XR) 500 MG 24 hr tablet    Sig: Take 1 tablet (500 mg total) by mouth 2 (two) times daily with a meal.    Dispense:  60 tablet    Refill:  3    Order Specific Question:   Supervising Provider    Answer:   Tresa Garter [id number]  . methocarbamol (ROBAXIN) 500 MG tablet    Sig: Take 1 tablet (500 mg total) by mouth 2 (two) times daily.    Dispense:  20 tablet    Refill:  0    Order Specific Question:   Supervising Provider    Answer:   Tresa Garter [id number]  . ibuprofen (ADVIL,MOTRIN) 400 MG tablet    Sig: Take 1 tablet (400 mg total) by mouth every 6 (six) hours as needed (Take with food.).    Dispense:  30 tablet    Refill:  0    Order Specific Question:   Supervising Provider    Answer:   Tresa Garter [id number]    Follow-up: Return in about 3 months (around 04/05/2017), or if symptoms worsen or fail to improve, for Pre-Diabetes.   Alfonse Spruce FNP

## 2017-01-04 LAB — TSH: TSH: 1.38 u[IU]/mL (ref 0.450–4.500)

## 2017-01-07 LAB — URINE CYTOLOGY ANCILLARY ONLY
Chlamydia: NEGATIVE
Neisseria Gonorrhea: NEGATIVE
Trichomonas: NEGATIVE

## 2017-01-09 ENCOUNTER — Telehealth: Payer: Self-pay

## 2017-01-09 LAB — URINE CYTOLOGY ANCILLARY ONLY: Candida vaginitis: NEGATIVE

## 2017-01-09 NOTE — Telephone Encounter (Signed)
-----   Message from Alfonse Spruce, [place] sent at 01/07/2017  6:48 PM EDT ----- Thyroid function normal.   Gonorrhea, Chlamydia, Trichomonas were all negative.

## 2017-01-09 NOTE — Progress Notes (Signed)
Spoke with patient and she stated that she wanted to be screen for BV too.

## 2017-01-09 NOTE — Telephone Encounter (Signed)
CMA call regarding lab results   Patient Verify DOB   Patient was aware and understood  

## 2017-01-10 ENCOUNTER — Telehealth: Payer: Self-pay

## 2017-01-10 ENCOUNTER — Other Ambulatory Visit: Payer: Self-pay | Admitting: Family Medicine

## 2017-01-10 DIAGNOSIS — N760 Acute vaginitis: Principal | ICD-10-CM

## 2017-01-10 DIAGNOSIS — B9689 Other specified bacterial agents as the cause of diseases classified elsewhere: Secondary | ICD-10-CM

## 2017-01-10 MED ORDER — METRONIDAZOLE 500 MG PO TABS: 500.0000 mg | ORAL_TABLET | Freq: Two times a day (BID) | ORAL | 0 refills | Status: DC

## 2017-01-10 NOTE — Telephone Encounter (Signed)
CMA call patient to let her know that she was screen for BV & yeast but it will come back longer   Patient was aware and understood

## 2017-01-10 NOTE — Telephone Encounter (Signed)
-----   Message from Alfonse Spruce, [place] sent at 01/09/2017  5:16 PM EDT ----- Patient was screened for BV and yeast as well. However these labs take longer to come back.  Fredia Beets FNP-BC  ----- Message ----- From: Marvetta Gibbons, CMA Sent: 01/09/2017   1:48 PM To: Alfonse Spruce, FNP  Spoke with patient and she stated that she wanted to be screen for BV too.

## 2017-01-10 NOTE — Telephone Encounter (Signed)
CMA call regarding lab results   Patient Verify DOB   Patient was aware and understood  

## 2017-01-10 NOTE — Telephone Encounter (Signed)
-----   Message from Alfonse Spruce, [place] sent at 01/10/2017  1:05 PM EDT ----- Bacterial vaginosis was positive. BV is caused by an overgrowth of germs in the vagina. You will be prescribed metronidazole to treat. To reduce your risk of developing BV don't douche, don't use scented soap or sprays, and use protection during sexual intercourse. Yeast negative.

## 2017-01-11 ENCOUNTER — Other Ambulatory Visit: Payer: Self-pay | Admitting: Family Medicine

## 2017-01-11 DIAGNOSIS — M545 Low back pain, unspecified: Secondary | ICD-10-CM

## 2017-01-11 MED ORDER — CYCLOBENZAPRINE HCL 10 MG PO TABS: 10.0000 mg | ORAL_TABLET | Freq: Three times a day (TID) | ORAL | 0 refills | Status: DC | PRN

## 2017-01-16 ENCOUNTER — Telehealth: Payer: Self-pay

## 2017-01-16 NOTE — Telephone Encounter (Signed)
-----   Message from Alfonse Spruce, [place] sent at 01/11/2017  6:45 PM EDT ----- Regarding: Medication Mgt. Received fax that methocarbamol (robaxin) for muscle spasm was on backorder at [facility] listed in patient chart. Discontinued medication and ordered flexeril instead for back spasm. Please notify patient.  Sincerely  Fredia Beets FNP-BC

## 2017-01-17 ENCOUNTER — Telehealth: Payer: Self-pay

## 2017-01-17 NOTE — Telephone Encounter (Signed)
-----   Message from Alfonse Spruce, [place] sent at 01/11/2017  6:45 PM EDT ----- Regarding: Medication Mgt. Received fax that methocarbamol (robaxin) for muscle spasm was on backorder at [facility] listed in patient chart. Discontinued medication and ordered flexeril instead for back spasm. Please notify patient.  Sincerely  Fredia Beets FNP-BC

## 2017-01-17 NOTE — Telephone Encounter (Signed)
CMA call regarding medication   Patient did not answer but left a detailed message  & to call back if have any questions

## 2017-01-21 ENCOUNTER — Ambulatory Visit (HOSPITAL_COMMUNITY)
Admission: EM | Admit: 2017-01-21 | Discharge: 2017-01-21 | Disposition: A | Discharge status: Home / Self Care | Payer: BLUE CROSS/BLUE SHIELD | Attending: Nurse Practitioner | Admitting: Nurse Practitioner

## 2017-01-21 ENCOUNTER — Encounter (HOSPITAL_COMMUNITY): Payer: Self-pay

## 2017-01-21 DIAGNOSIS — B9789 Other viral agents as the cause of diseases classified elsewhere: Secondary | ICD-10-CM | POA: Insufficient documentation

## 2017-01-21 DIAGNOSIS — J029 Acute pharyngitis, unspecified: Secondary | ICD-10-CM | POA: Insufficient documentation

## 2017-01-21 LAB — POCT RAPID STREP A: STREPTOCOCCUS, GROUP A SCREEN (DIRECT): NEGATIVE

## 2017-01-21 MED ORDER — IBUPROFEN 800 MG PO TABS
800.0000 mg | ORAL_TABLET | Freq: Three times a day (TID) | ORAL | 0 refills | Status: DC
Start: 2017-01-21 — End: 2018-01-07

## 2017-01-21 NOTE — ED Provider Notes (Signed)
[facility]    CSN: [id number] Arrival date & time: 01/21/17  1850     History   Chief Complaint Chief Complaint  Patient presents with  . Sore Throat    HPI Michaela Carrillo is a 31 y.o. female.   Subjective:   Michaela Carrillo is a 31 y.o. female who presents for evaluation of a sore throat. Associated symptoms include chills, pain while swallowing and nasal congestion. She denies any cough, myalgias, fevers, sweats, nausea or vomiting. Onset of symptoms was 2 days ago and has been unchanged since that time.  She is drinking plenty of fluids. She has not had recent close exposure to someone with proven streptococcal pharyngitis.  The following portions of the patient's history were reviewed and updated as appropriate: allergies, current medications, past family history, past medical history, past social history, past surgical history and problem list.           Past Medical History:  Diagnosis Date  . Abnormal Pap smear 2012  . Preeclampsia     Patient Active Problem List   Diagnosis Date Noted  . TOE PAIN 03/01/2010  . SHIN SPLINTS 01/05/2010  . LEG CRAMPS 11/30/2009  . Anemia 07/04/2009  . TINEA CORPORIS 06/30/2009  . HEADACHE, TENSION 09/06/2008  . 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 Abnormal Pap smear of cervix 05/06/2008  . MORBID OBESITY 04/09/2008  . INTERNAL HEMORRHOIDS 04/09/2008  . HELICOBACTER PYLORI INFECTION, HX OF 04/09/2008    Past Surgical History:  Procedure Laterality Date  . CESAREAN SECTION  2005  . COLPOSCOPY  2012    OB History    Gravida Para Term Preterm AB Living   2 1 0 1 1 1    SAB TAB Ectopic Multiple Live Births   0 1 0 0         Home Medications    Prior to Admission medications   Medication Sig Start Date End Date Taking? Authorizing Provider  ibuprofen (ADVIL,MOTRIN) 400 MG tablet Take 1 tablet (400 mg total) by mouth every 6 (six) hours as needed (Take with food.). 01/03/17  Yes Hairston, Maylon Peppers, FNP    metFORMIN (GLUCOPHAGE-XR) 500 MG 24 hr tablet Take 1 tablet (500 mg total) by mouth 2 (two) times daily with a meal. 01/03/17  Yes Hairston, Mandesia R, FNP  cyclobenzaprine (FLEXERIL) 10 MG tablet Take 1 tablet (10 mg total) by mouth 3 (three) times daily as needed for muscle spasms. 01/11/17   Alfonse Spruce, FNP  metroNIDAZOLE (FLAGYL) 500 MG tablet Take 1 tablet (500 mg total) by mouth 2 (two) times daily. 01/10/17   Alfonse Spruce, FNP    Family History Family History  Problem Relation Age of Onset  . Hypertension Mother   . Diabetes Mother   . Hypertension Father     Social History Social History  Substance Use Topics  . Smoking status: Never Smoker  . Smokeless tobacco: Never Used  . Alcohol use No     Allergies   Codeine   Review of Systems Review of Systems  Constitutional: Positive for chills. Negative for fever.  HENT: Positive for congestion and sore throat. Negative for rhinorrhea.   Respiratory: Negative for shortness of breath and wheezing.   Cardiovascular: Negative for chest pain.  Gastrointestinal: Negative for nausea and vomiting.     Physical Exam Triage Vital Signs ED Triage Vitals  Enc Vitals Group     BP 01/21/17 1938 119/60     Pulse Rate 01/21/17 1938 71  Resp 01/21/17 1938 15     Temp 01/21/17 1938 99.2 F (37.3 C)     Temp Source 01/21/17 1938 Oral     SpO2 01/21/17 1938 100 %     Weight --      Height --      Head Circumference --      Peak Flow --      Pain Score 01/21/17 1939 10     Pain Loc --      Pain Edu? --      Excl. in [place]? --    No data found.   Updated Vital Signs BP 119/60 (BP Location: Left Arm)   Pulse 71   Temp 99.2 F (37.3 C) (Oral)   Resp 15   LMP 01/12/2017 (Exact Date)   SpO2 100%   Visual Acuity Right Eye Distance:   Left Eye Distance:   Bilateral Distance:    Right Eye Near:   Left Eye Near:    Bilateral Near:     Physical Exam  Constitutional: She is oriented to person,  place, and time. She appears well-developed and well-nourished.  HENT:  Mild left-sided pharyngeal edema and redness. No exudate noted.  Eyes: Conjunctivae are normal.  Neck: Normal range of motion. Neck supple.  Cardiovascular: Normal rate and regular rhythm.   Pulmonary/Chest: Effort normal and breath sounds normal.  Musculoskeletal: Normal range of motion.  Lymphadenopathy:    She has no cervical adenopathy.  Neurological: She is alert and oriented to person, place, and time.  Skin: Skin is warm and dry.  Psychiatric: She has a normal mood and affect.     UC Treatments / Results  Labs (all labs ordered are listed, but only abnormal results are displayed) Labs Reviewed - No data to display  EKG  EKG Interpretation None       Radiology No results found.  Procedures Procedures (including critical care time)  Medications Ordered in UC Medications - No data to display   Initial Impression / Assessment and Plan / UC Course  I have reviewed the triage vital signs and the nursing notes.  Pertinent labs & imaging results that were available during my care of the patient were reviewed by me and considered in my medical decision making (see chart for details).     31 year old female with a two-day history of sore throat, chills, pain. Only and nasal congestion. Patient is nontoxic appearing. Vital signs are stable. Rapid strep negative. Cultures pending and we will notify patient of results. Use of OTC analgesics recommended as well as salt water gargles.   Discussed diagnosis and treatment with patient. All questions have been answered and all concerns have been addressed. The patient verbalized understanding and had no further questions   Final Clinical Impressions(s) / UC Diagnoses   Final diagnoses:  Viral pharyngitis    New Prescriptions New Prescriptions   No medications on file     Controlled Substance Prescriptions [place] Controlled Substance Registry  consulted? Not Applicable   Michaela Carrillo, [facility] 01/21/17 2042

## 2017-01-21 NOTE — ED Triage Notes (Signed)
Patient presents to [facility] for sore throat and chills since yesterday 01/20/2017, pt has taken ibuprofen to treat symptoms but has no relief

## 2017-01-23 LAB — CULTURE, GROUP A STREP (THRC)

## 2017-01-24 ENCOUNTER — Telehealth (HOSPITAL_COMMUNITY): Payer: Self-pay | Admitting: Internal Medicine

## 2017-01-24 MED ORDER — PENICILLIN V POTASSIUM 500 MG PO TABS: 500.0000 mg | ORAL_TABLET | Freq: Two times a day (BID) | ORAL | 0 refills | Status: DC

## 2017-01-24 NOTE — Telephone Encounter (Signed)
Clinical staff, please let patient know that throat culture was positive for moderate non group A Strep.  Rx penicillin V was sent to the pharmacy of record, [facility] on [address] at [place].  Recheck or followup with PCP for further evaluation if symptoms are not improving.  LM

## 2017-08-11 ENCOUNTER — Emergency Department (HOSPITAL_COMMUNITY)
Admission: EM | Admit: 2017-08-11 | Discharge: 2017-08-11 | Discharge status: Against Medical Advice | Payer: BLUE CROSS/BLUE SHIELD

## 2017-08-11 NOTE — ED Notes (Addendum)
Called for triage no answer  

## 2017-08-11 NOTE — ED Notes (Signed)
Patient did not answer for triage

## 2017-08-20 ENCOUNTER — Encounter (HOSPITAL_COMMUNITY): Payer: Self-pay | Admitting: Family Medicine

## 2017-08-20 ENCOUNTER — Ambulatory Visit (HOSPITAL_COMMUNITY)
Admission: EM | Admit: 2017-08-20 | Discharge: 2017-08-20 | Disposition: A | Discharge status: Home / Self Care | Payer: Self-pay | Attending: Family Medicine | Admitting: Family Medicine

## 2017-08-20 DIAGNOSIS — R300 Dysuria: Secondary | ICD-10-CM

## 2017-08-20 LAB — POCT URINALYSIS DIP (DEVICE)
BILIRUBIN URINE: NEGATIVE
GLUCOSE, UA: NEGATIVE mg/dL
Hgb urine dipstick: NEGATIVE
KETONES UR: NEGATIVE mg/dL
LEUKOCYTES UA: NEGATIVE
Nitrite: NEGATIVE
PROTEIN: NEGATIVE mg/dL
Specific Gravity, Urine: 1.01 (ref 1.005–1.030)
Urobilinogen, UA: 0.2 mg/dL (ref 0.0–1.0)
pH: 6 (ref 5.0–8.0)

## 2017-08-20 LAB — POCT PREGNANCY, URINE: PREG TEST UR: NEGATIVE

## 2017-08-20 MED ORDER — METRONIDAZOLE 500 MG PO TABS: 500.0000 mg | ORAL_TABLET | Freq: Two times a day (BID) | ORAL | 0 refills | Status: AC

## 2017-08-20 NOTE — ED Provider Notes (Signed)
[facility]    CSN: [id number] Arrival date & time: 08/20/17  1850     History   Chief Complaint Chief Complaint  Patient presents with  . Back Pain  . Abdominal Pain  . Dysuria    HPI Michaela Carrillo is a 32 y.o. female no contributing past medical history presenting today for evaluation of lower back pain, pelvic pressure and dysuria.  Patient states she has a history of BV and these are the symptoms she typically has.  Symptoms have been going on for a couple of months.  She has not used anything for her symptoms.  Also concerned about STDs.  Denies discharge.  Last menstrual period 5/11, does not use any form of birth control.  HPI  Past Medical History:  Diagnosis Date  . Abnormal Pap smear 2012  . Preeclampsia     Patient Active Problem List   Diagnosis Date Noted  . TOE PAIN 03/01/2010  . SHIN SPLINTS 01/05/2010  . LEG CRAMPS 11/30/2009  . Anemia 07/04/2009  . TINEA CORPORIS 06/30/2009  . HEADACHE, TENSION 09/06/2008  . 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 Abnormal Pap smear of cervix 05/06/2008  . MORBID OBESITY 04/09/2008  . INTERNAL HEMORRHOIDS 04/09/2008  . HELICOBACTER PYLORI INFECTION, HX OF 04/09/2008    Past Surgical History:  Procedure Laterality Date  . CESAREAN SECTION  2005  . COLPOSCOPY  2012    OB History    Gravida  2   Para  1   Term  0   Preterm  1   AB  1   Living  1     SAB  0   TAB  1   Ectopic  0   Multiple  0   Live Births               Home Medications    Prior to Admission medications   Medication Sig Start Date End Date Taking? Authorizing Provider  ibuprofen (ADVIL,MOTRIN) 800 MG tablet Take 1 tablet (800 mg total) by mouth 3 (three) times daily. 01/21/17   Enrique Sack, FNP  metroNIDAZOLE (FLAGYL) 500 MG tablet Take 1 tablet (500 mg total) by mouth 2 (two) times daily for 7 days. 08/20/17 08/27/17  Wieters, Elesa Hacker, PA-C    Family History Family History  Problem Relation Age of Onset  .  Hypertension Mother   . Diabetes Mother   . Hypertension Father     Social History Social History   Tobacco Use  . Smoking status: Never Smoker  . Smokeless tobacco: Never Used  Substance Use Topics  . Alcohol use: No  . Drug use: No     Allergies   Codeine   Review of Systems Review of Systems  Constitutional: Negative for fever.  Respiratory: Negative for shortness of breath.   Cardiovascular: Negative for chest pain.  Gastrointestinal: Negative for abdominal pain, diarrhea, nausea and vomiting.  Genitourinary: Positive for dysuria. Negative for flank pain, genital sores, hematuria, menstrual problem, pelvic pain, vaginal bleeding, vaginal discharge and vaginal pain.  Musculoskeletal: Positive for back pain.  Skin: Negative for rash.  Neurological: Negative for dizziness, light-headedness and headaches.     Physical Exam Triage Vital Signs ED Triage Vitals [08/20/17 1929]  Enc Vitals Group     BP      Pulse      Resp      Temp      Temp src      SpO2      Weight  Height      Head Circumference      Peak Flow      Pain Score 8     Pain Loc      Pain Edu?      Excl. in [place]?    No data found.  Updated Vital Signs LMP 08/10/2017   Visual Acuity Right Eye Distance:   Left Eye Distance:   Bilateral Distance:    Right Eye Near:   Left Eye Near:    Bilateral Near:     Physical Exam  Constitutional: She appears well-developed and well-nourished. No distress.  HENT:  Head: Normocephalic and atraumatic.  Eyes: Conjunctivae are normal.  Neck: Neck supple.  Cardiovascular: Normal rate and regular rhythm.  No murmur heard. Pulmonary/Chest: Effort normal and breath sounds normal. No respiratory distress.  Abdominal: Soft. There is no tenderness.  Abdomen soft, nondistended, nontender to light and deep palpation  Musculoskeletal: She exhibits no edema.  Neurological: She is alert.  Skin: Skin is warm and dry.  Psychiatric: She has a normal mood  and affect.  Nursing note and vitals reviewed.    UC Treatments / Results  Labs (all labs ordered are listed, but only abnormal results are displayed) Labs Reviewed  POCT URINALYSIS DIP (DEVICE)  POCT PREGNANCY, URINE  CERVICOVAGINAL ANCILLARY ONLY    EKG None  Radiology No results found.  Procedures Procedures (including critical care time)  Medications Ordered in UC Medications - No data to display  Initial Impression / Assessment and Plan / UC Course  I have reviewed the triage vital signs and the nursing notes.  Pertinent labs & imaging results that were available during my care of the patient were reviewed by me and considered in my medical decision making (see chart for details).     Patient with history of BV reporting recurrent symptoms.  We will go ahead and initiate treatment for BV.  UA negative for UTI.  Vaginal swab obtained.  Will send off and alter treatment as needed.  Metronidazole orally prescribed.Discussed strict return precautions. Patient verbalized understanding and is agreeable with plan.  Final Clinical Impressions(s) / UC Diagnoses   Final diagnoses:  Dysuria     Discharge Instructions     I have sent in metronidazole tablets for Bacterial vaginosis. Take 1 tablet twice a day for 7 days. Do not drink alcohol while taking.  We are testing you for Gonorrhea, Chlamydia, Trichomonas, Yeast and Bacterial Vaginosis. We will call you if anything is positive and let you know if you require any further treatment. Please inform partners of any positive results.   Please return if symptoms not improving with treatment, development of fever, nausea, vomiting, abdominal pain.     ED Prescriptions    Medication Sig Dispense Auth. Provider   metroNIDAZOLE (FLAGYL) 500 MG tablet Take 1 tablet (500 mg total) by mouth 2 (two) times daily for 7 days. 14 tablet Wieters, [place] C, PA-C     Controlled Substance Prescriptions [place] Controlled Substance  Registry consulted? No   Janith Lima, [place] 08/20/17 1957

## 2017-08-20 NOTE — Discharge Instructions (Signed)
I have sent in metronidazole tablets for Bacterial vaginosis. Take 1 tablet twice a day for 7 days. Do not drink alcohol while taking.  We are testing you for Gonorrhea, Chlamydia, Trichomonas, Yeast and Bacterial Vaginosis. We will call you if anything is positive and let you know if you require any further treatment. Please inform partners of any positive results.   Please return if symptoms not improving with treatment, development of fever, nausea, vomiting, abdominal pain.

## 2017-08-20 NOTE — ED Triage Notes (Signed)
Pt here with hx of BV here for lower back pain, pelvic pain and dysuria.

## 2017-08-21 LAB — CERVICOVAGINAL ANCILLARY ONLY
Bacterial vaginitis: POSITIVE — AB
CHLAMYDIA, DNA PROBE: NEGATIVE
Candida vaginitis: NEGATIVE
Neisseria Gonorrhea: NEGATIVE
Trichomonas: NEGATIVE

## 2018-01-07 ENCOUNTER — Ambulatory Visit (HOSPITAL_COMMUNITY)
Admission: EM | Admit: 2018-01-07 | Discharge: 2018-01-07 | Disposition: A | Discharge status: Home / Self Care | Payer: Self-pay | Attending: Family Medicine | Admitting: Family Medicine

## 2018-01-07 ENCOUNTER — Encounter (HOSPITAL_COMMUNITY): Payer: Self-pay | Admitting: Family Medicine

## 2018-01-07 ENCOUNTER — Other Ambulatory Visit: Payer: Self-pay

## 2018-01-07 DIAGNOSIS — R1013 Epigastric pain: Secondary | ICD-10-CM

## 2018-01-07 MED ORDER — FAMOTIDINE 20 MG PO TABS: 20.0000 mg | ORAL_TABLET | Freq: Two times a day (BID) | ORAL | 0 refills | Status: DC

## 2018-01-07 MED ORDER — ONDANSETRON 8 MG PO TBDP: 8.0000 mg | ORAL_TABLET | Freq: Three times a day (TID) | ORAL | 0 refills | Status: DC | PRN

## 2018-01-07 NOTE — ED Provider Notes (Signed)
[place]    CSN: [id number] Arrival date & time: 01/07/18  1057     History   Chief Complaint Chief Complaint  Patient presents with  . Abdominal Pain    HPI Michaela Carrillo is a 32 y.o. female.   32 year old woman who presents with abdominal pain.  Pain is located in the epigastrium and is fairly constant.  Patient is passing gas.  She has had no vomiting or diarrhea.  Pain began when she woke up this morning.  She attributes the discomfort to having had a greasy meal last night.  She has had some sweats but does not think she is had a fever.  She tried some Dramamine from the over-the-counter section at her pharmacy and that has not helped.  Patient works as a Quarry manager.     Past Medical History:  Diagnosis Date  . Abnormal Pap smear 2012  . Preeclampsia     Patient Active Problem List   Diagnosis Date Noted  . TOE PAIN 03/01/2010  . SHIN SPLINTS 01/05/2010  . LEG CRAMPS 11/30/2009  . Anemia 07/04/2009  . TINEA CORPORIS 06/30/2009  . HEADACHE, TENSION 09/06/2008  . 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 Abnormal Pap smear of cervix 05/06/2008  . MORBID OBESITY 04/09/2008  . INTERNAL HEMORRHOIDS 04/09/2008  . HELICOBACTER PYLORI INFECTION, HX OF 04/09/2008    Past Surgical History:  Procedure Laterality Date  . CESAREAN SECTION  2005  . COLPOSCOPY  2012    OB History    Gravida  2   Para  1   Term  0   Preterm  1   AB  1   Living  1     SAB  0   TAB  1   Ectopic  0   Multiple  0   Live Births               Home Medications    Prior to Admission medications   Medication Sig Start Date End Date Taking? Authorizing Provider  famotidine (PEPCID) 20 MG tablet Take 1 tablet (20 mg total) by mouth 2 (two) times daily. 01/07/18   Robyn Haber, MD  ondansetron (ZOFRAN-ODT) 8 MG disintegrating tablet Take 1 tablet (8 mg total) by mouth every 8 (eight) hours as needed for nausea. 01/07/18   Robyn Haber, MD    Family History Family  History  Problem Relation Age of Onset  . Hypertension Mother   . Diabetes Mother   . Hypertension Father     Social History Social History   Tobacco Use  . Smoking status: Never Smoker  . Smokeless tobacco: Never Used  Substance Use Topics  . Alcohol use: No  . Drug use: No     Allergies   Codeine   Review of Systems Review of Systems   Physical Exam Triage Vital Signs ED Triage Vitals  Enc Vitals Group     BP      Pulse      Resp      Temp      Temp src      SpO2      Weight      Height      Head Circumference      Peak Flow      Pain Score      Pain Loc      Pain Edu?      Excl. in [place]?    No data found.  Updated Vital Signs BP 105/60 (BP  Location: Left Arm)   Pulse 63   Temp 98.7 F (37.1 C)   Resp 18   LMP 12/26/2017   SpO2 99%    Physical Exam  Constitutional: She appears well-developed and well-nourished.  HENT:  Head: Normocephalic and atraumatic.  Eyes: Pupils are equal, round, and reactive to light. EOM are normal.  Cardiovascular: Normal rate and normal heart sounds.  Pulmonary/Chest: Effort normal and breath sounds normal.  Abdominal: Bowel sounds are normal. There is no hepatosplenomegaly. There is tenderness in the epigastric area. There is no rigidity, no rebound, no guarding, no CVA tenderness and negative Murphy's sign.  Nursing note and vitals reviewed.    UC Treatments / Results  Labs (all labs ordered are listed, but only abnormal results are displayed) Labs Reviewed - No data to display  EKG None  Radiology No results found.  Procedures Procedures (including critical care time)  Medications Ordered in UC Medications - No data to display  Initial Impression / Assessment and Plan / UC Course  I have reviewed the triage vital signs and the nursing notes.  Pertinent labs & imaging results that were available during my care of the patient were reviewed by me and considered in my medical decision making (see chart  for details).    Final Clinical Impressions(s) / UC Diagnoses   Final diagnoses:  Epigastric pain   Discharge Instructions   None    ED Prescriptions    Medication Sig Dispense Auth. Provider   ondansetron (ZOFRAN-ODT) 8 MG disintegrating tablet Take 1 tablet (8 mg total) by mouth every 8 (eight) hours as needed for nausea. 12 tablet Robyn Haber, MD   famotidine (PEPCID) 20 MG tablet Take 1 tablet (20 mg total) by mouth 2 (two) times daily. 30 tablet Robyn Haber, MD     Controlled Substance Prescriptions [place] Controlled Substance Registry consulted? Not Applicable   Robyn Haber, MD 01/07/18 1202

## 2018-01-07 NOTE — ED Triage Notes (Addendum)
Pt states she has stomach pain this started last night. Pt thinks its some food she ate last night.

## 2018-02-10 ENCOUNTER — Encounter (HOSPITAL_COMMUNITY): Payer: Self-pay | Admitting: Emergency Medicine

## 2018-02-10 ENCOUNTER — Ambulatory Visit (HOSPITAL_COMMUNITY)
Admission: EM | Admit: 2018-02-10 | Discharge: 2018-02-10 | Disposition: A | Discharge status: Home / Self Care | Payer: Self-pay | Attending: Family Medicine | Admitting: Family Medicine

## 2018-02-10 ENCOUNTER — Other Ambulatory Visit: Payer: Self-pay

## 2018-02-10 DIAGNOSIS — Z79899 Other long term (current) drug therapy: Secondary | ICD-10-CM | POA: Insufficient documentation

## 2018-02-10 DIAGNOSIS — N898 Other specified noninflammatory disorders of vagina: Secondary | ICD-10-CM | POA: Insufficient documentation

## 2018-02-10 DIAGNOSIS — Z885 Allergy status to narcotic agent status: Secondary | ICD-10-CM | POA: Insufficient documentation

## 2018-02-10 LAB — POCT URINALYSIS DIP (DEVICE)
Bilirubin Urine: NEGATIVE
GLUCOSE, UA: NEGATIVE mg/dL
Ketones, ur: NEGATIVE mg/dL
Leukocytes, UA: NEGATIVE
Nitrite: NEGATIVE
PH: 5.5 (ref 5.0–8.0)
PROTEIN: NEGATIVE mg/dL
SPECIFIC GRAVITY, URINE: 1.02 (ref 1.005–1.030)
UROBILINOGEN UA: 1 mg/dL (ref 0.0–1.0)

## 2018-02-10 LAB — POCT PREGNANCY, URINE: Preg Test, Ur: NEGATIVE

## 2018-02-10 MED ORDER — FLUCONAZOLE 150 MG PO TABS: 150.0000 mg | ORAL_TABLET | Freq: Once | ORAL | 0 refills | Status: AC

## 2018-02-10 MED ORDER — NYSTATIN 100000 UNIT/GM EX CREA: 1.0000 "application " | TOPICAL_CREAM | Freq: Two times a day (BID) | CUTANEOUS | 0 refills | Status: DC

## 2018-02-10 MED ORDER — METRONIDAZOLE 500 MG PO TABS: 500.0000 mg | ORAL_TABLET | Freq: Two times a day (BID) | ORAL | 0 refills | Status: AC

## 2018-02-10 NOTE — ED Triage Notes (Signed)
Denies vaginal discharge.  Patient reports irritation, noticed yesterday

## 2018-02-10 NOTE — Discharge Instructions (Signed)
Irritation likely from yeast of Bv. Please begin diflucan 1 tablet today, may repeat in 2-3 days Use nystatin cream twice daily to outer labia Please also start metronidazole twice daily for 1 week to treat BV Do not drink alcohol while taking  Follow up if not improving with the above

## 2018-02-11 LAB — CERVICOVAGINAL ANCILLARY ONLY
BACTERIAL VAGINITIS: NEGATIVE
CANDIDA VAGINITIS: NEGATIVE
CHLAMYDIA, DNA PROBE: NEGATIVE
NEISSERIA GONORRHEA: NEGATIVE
Trichomonas: NEGATIVE

## 2018-02-11 NOTE — ED Provider Notes (Signed)
[place]    CSN: [id number] Arrival date & time: 02/10/18  1752     History   Chief Complaint Chief Complaint  Patient presents with  . Vaginal Itching    HPI Michaela Carrillo is a 32 y.o. female no contributing past medical history presenting today for evaluation of vaginal irritation.  Patient states that she has noticed irritation to her outer lips over the past few days.  It is uncomfortable at rest.  Denies dysuria.  Denies increased frequency.  Denies vaginal discharge.  Last menstrual period was around 10/17.  Patient is on a form of birth control.  Patient denies any new partners, but is sexually active.    HPI  Past Medical History:  Diagnosis Date  . Abnormal Pap smear 2012  . Preeclampsia     Patient Active Problem List   Diagnosis Date Noted  . TOE PAIN 03/01/2010  . SHIN SPLINTS 01/05/2010  . LEG CRAMPS 11/30/2009  . Anemia 07/04/2009  . TINEA CORPORIS 06/30/2009  . HEADACHE, TENSION 09/06/2008  . 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 Abnormal Pap smear of cervix 05/06/2008  . MORBID OBESITY 04/09/2008  . INTERNAL HEMORRHOIDS 04/09/2008  . HELICOBACTER PYLORI INFECTION, HX OF 04/09/2008    Past Surgical History:  Procedure Laterality Date  . CESAREAN SECTION  2005  . COLPOSCOPY  2012    OB History    Gravida  2   Para  1   Term  0   Preterm  1   AB  1   Living  1     SAB  0   TAB  1   Ectopic  0   Multiple  0   Live Births               Home Medications    Prior to Admission medications   Medication Sig Start Date End Date Taking? Authorizing Provider  famotidine (PEPCID) 20 MG tablet Take 1 tablet (20 mg total) by mouth 2 (two) times daily. 01/07/18   Robyn Haber, MD  metroNIDAZOLE (FLAGYL) 500 MG tablet Take 1 tablet (500 mg total) by mouth 2 (two) times daily for 7 days. 02/10/18 02/17/18  Debra Calabretta C, PA-C  nystatin cream (MYCOSTATIN) Apply 1 application topically 2 (two) times daily. 02/10/18   Daren Yeagle,  Elesa Hacker, PA-C    Family History Family History  Problem Relation Age of Onset  . Hypertension Mother   . Diabetes Mother   . Hypertension Father     Social History Social History   Tobacco Use  . Smoking status: Never Smoker  . Smokeless tobacco: Never Used  Substance Use Topics  . Alcohol use: No  . Drug use: No     Allergies   Codeine   Review of Systems Review of Systems  Constitutional: Negative for fever.  Respiratory: Negative for shortness of breath.   Cardiovascular: Negative for chest pain.  Gastrointestinal: Negative for abdominal pain, diarrhea, nausea and vomiting.  Genitourinary: Negative for dysuria, flank pain, genital sores, hematuria, menstrual problem, vaginal bleeding, vaginal discharge and vaginal pain.  Musculoskeletal: Negative for back pain.  Skin: Negative for rash.  Neurological: Negative for dizziness, light-headedness and headaches.     Physical Exam Triage Vital Signs ED Triage Vitals  Enc Vitals Group     BP 02/10/18 1919 134/66     Pulse Rate 02/10/18 1919 75     Resp 02/10/18 1919 18     Temp 02/10/18 1919 97.9 F (36.6 C)  Temp Source 02/10/18 1919 Oral     SpO2 02/10/18 1919 99 %     Weight --      Height --      Head Circumference --      Peak Flow --      Pain Score 02/10/18 1916 10     Pain Loc --      Pain Edu? --      Excl. in [place]? --    No data found.  Updated Vital Signs BP 134/66 (BP Location: Left Arm)   Pulse 75   Temp 97.9 F (36.6 C) (Oral)   Resp 18   SpO2 99%   Visual Acuity Right Eye Distance:   Left Eye Distance:   Bilateral Distance:    Right Eye Near:   Left Eye Near:    Bilateral Near:     Physical Exam  Constitutional: She is oriented to person, place, and time. She appears well-developed and well-nourished.  No acute distress  HENT:  Head: Normocephalic and atraumatic.  Nose: Nose normal.  Eyes: Conjunctivae are normal.  Neck: Neck supple.  Cardiovascular: Normal rate.    Pulmonary/Chest: Effort normal. No respiratory distress.  Abdominal: She exhibits no distension.  Genitourinary:  Genitourinary Comments: Bilateral labia majora with erythema, no bumps or lesions, no scabbing Small amount of white milky discharge on vaginal exam, no cervical erythema No cervical motion tenderness  Musculoskeletal: Normal range of motion.  Neurological: She is alert and oriented to person, place, and time.  Skin: Skin is warm and dry.  Psychiatric: She has a normal mood and affect.  Nursing note and vitals reviewed.    UC Treatments / Results  Labs (all labs ordered are listed, but only abnormal results are displayed) Labs Reviewed  POCT URINALYSIS DIP (DEVICE) - Abnormal; Notable for the following components:      Result Value   Hgb urine dipstick MODERATE (*)    All other components within normal limits  POCT PREGNANCY, URINE  CERVICOVAGINAL ANCILLARY ONLY    EKG None  Radiology No results found.  Procedures Procedures (including critical care time)  Medications Ordered in UC Medications - No data to display  Initial Impression / Assessment and Plan / UC Course  I have reviewed the triage vital signs and the nursing notes.  Pertinent labs & imaging results that were available during my care of the patient were reviewed by me and considered in my medical decision making (see chart for details).     Patient with irritation to the labia, likely from yeast.  Will empirically treat for this, will provide nystatin to apply to labia, Diflucan p.o.  Patient also has a history of BV and will start patient on this.  Vaginal swab obtained to confirm as well as check for STDs.Discussed strict return precautions. Patient verbalized understanding and is agreeable with plan.  Final Clinical Impressions(s) / UC Diagnoses   Final diagnoses:  Vaginal irritation     Discharge Instructions     Irritation likely from yeast of Bv. Please begin diflucan 1 tablet  today, may repeat in 2-3 days Use nystatin cream twice daily to outer labia Please also start metronidazole twice daily for 1 week to treat BV Do not drink alcohol while taking  Follow up if not improving with the above   ED Prescriptions    Medication Sig Dispense Auth. Provider   nystatin cream (MYCOSTATIN) Apply 1 application topically 2 (two) times daily. 30 g Charlyn Vialpando, [place] C, [place]  fluconazole (DIFLUCAN) 150 MG tablet Take 1 tablet (150 mg total) by mouth once for 1 dose. 2 tablet Zharia Conrow C, PA-C   metroNIDAZOLE (FLAGYL) 500 MG tablet Take 1 tablet (500 mg total) by mouth 2 (two) times daily for 7 days. 14 tablet Gerhart Ruggieri, [place] C, PA-C     Controlled Substance Prescriptions [place] Controlled Substance Registry consulted? Not Applicable   Janith Lima, [place] 02/11/18 1725

## 2018-03-28 ENCOUNTER — Encounter: Payer: Self-pay | Admitting: Obstetrics & Gynecology

## 2018-03-28 ENCOUNTER — Ambulatory Visit (INDEPENDENT_AMBULATORY_CARE_PROVIDER_SITE_OTHER): Payer: Medicaid Other | Admitting: Obstetrics & Gynecology

## 2018-03-28 ENCOUNTER — Other Ambulatory Visit (HOSPITAL_COMMUNITY)
Admission: RE | Admit: 2018-03-28 | Discharge: 2018-03-28 | Disposition: A | Discharge status: Home / Self Care | Payer: Medicaid Other | Source: Ambulatory Visit | Attending: Obstetrics & Gynecology | Admitting: Obstetrics & Gynecology

## 2018-03-28 VITALS — BP 109/70 | HR 73 | Ht 64.0 in | Wt 305.4 lb

## 2018-03-28 DIAGNOSIS — R87810 Cervical high risk human papillomavirus (HPV) DNA test positive: Secondary | ICD-10-CM

## 2018-03-28 DIAGNOSIS — Z0000 Encounter for general adult medical examination without abnormal findings: Secondary | ICD-10-CM

## 2018-03-28 DIAGNOSIS — Z23 Encounter for immunization: Secondary | ICD-10-CM

## 2018-03-28 DIAGNOSIS — Z01419 Encounter for gynecological examination (general) (routine) without abnormal findings: Secondary | ICD-10-CM | POA: Diagnosis present

## 2018-03-28 DIAGNOSIS — R87610 Atypical squamous cells of undetermined significance on cytologic smear of cervix (ASC-US): Secondary | ICD-10-CM

## 2018-03-28 DIAGNOSIS — F439 Reaction to severe stress, unspecified: Secondary | ICD-10-CM

## 2018-03-28 NOTE — Progress Notes (Signed)
Pt is here for annual gyn exam. Last pap 05/09/16 ASCUS positive HPV.

## 2018-03-28 NOTE — Progress Notes (Signed)
Subjective:    Michaela Carrillo is a 32 y.o. single P28 (32 yo daghter) female who presents for an annual exam. She is having a lot of stress from her daughter. The patient is sexually active. GYN screening history: last pap: was abnormal: ASCUS, + HR HPV. The patient wears seatbelts: yes. The patient participates in regular exercise: "a little bit". Has the patient ever been transfused or tattooed?: no. The patient reports that there is not domestic violence in her life.   Menstrual History: OB History    Gravida  2   Para  1   Term  0   Preterm  1   AB  1   Living  1     SAB  0   TAB  1   Ectopic  0   Multiple  0   Live Births              Menarche age: 15 Patient's last menstrual period was 03/09/2018 (exact date).    The following portions of the patient's history were reviewed and updated as appropriate: allergies, current medications, past family history, past medical history, past social history, past surgical history and problem list.  Review of Systems Pertinent items are noted in HPI.   FH- no breast/gyn/colon cancer Works at [employer], [employer] Dating for 7 months Having unprotected sex for years Periods are about q 24-30 days   Objective:    BP 109/70   Pulse 73   Ht 5\' 4"  (1.626 m)   Wt (!) 305 lb 6.4 oz (138.5 kg)   LMP 03/09/2018 (Exact Date)   BMI 52.42 kg/m   General Appearance:    Alert, cooperative, no distress, appears stated age  Head:    Normocephalic, without obvious abnormality, atraumatic  Eyes:    PERRL, conjunctiva/corneas clear, EOM's intact, fundi    benign, both eyes  Ears:    Normal TM's and external ear canals, both ears  Nose:   Nares normal, septum midline, mucosa normal, no drainage    or sinus tenderness  Throat:   Lips, mucosa, and tongue normal; teeth and gums normal  Neck:   Supple, symmetrical, trachea midline, no adenopathy;    thyroid:  no enlargement/tenderness/nodules; no carotid   bruit or JVD  Back:      Symmetric, no curvature, ROM normal, no CVA tenderness  Lungs:     Clear to auscultation bilaterally, respirations unlabored  Chest Wall:    No tenderness or deformity   Heart:    Regular rate and rhythm, S1 and S2 normal, no murmur, rub   or gallop  Breast Exam:    No tenderness, masses, or nipple abnormality  Abdomen:     Soft, non-tender, bowel sounds active all four quadrants,    no masses, no organomegaly  Genitalia:    Normal female without lesion, discharge or tenderness, normal size and shape, anteverted, mobile, non-tender, normal adnexal exam      Extremities:   Extremities normal, atraumatic, no cyanosis or edema  Pulses:   2+ and symmetric all extremities  Skin:   Skin color, texture, turgor normal, no rashes or lesions  Lymph nodes:   Cervical, supraclavicular, and axillary nodes normal  Neurologic:   CNII-XII intact, normal strength, sensation and reflexes    throughout  .    Assessment:    Healthy female exam.   Teenage daughter stress   Plan:     Thin prep Pap smear. with cotesting Schedule colpo and fasting labs.  If pap is normal, cancel colpo STI testing Schedule appt with Roselyn Reef

## 2018-03-28 NOTE — Patient Instructions (Signed)
Diet for Polycystic Ovary Syndrome Polycystic ovary syndrome (PCOS) is a disorder of the chemicals (hormones) that regulate a woman's reproductive system, including monthly periods (menstruation). The condition causes important hormones to be out of balance. PCOS can:  Stop your periods or make them irregular.  Cause cysts to develop on your ovaries.  Make it difficult to get pregnant.  Stop your body from responding to the effects of insulin (insulin resistance). Insulin resistance can lead to obesity and diabetes. Changing what you eat can help you manage PCOS and improve your health. Following a balanced diet can help you lose weight and improve the way that your body uses insulin. What are tips for following this plan?  Follow a balanced diet for meals and snacks. Eat breakfast, lunch, dinner, and one or two snacks every day.  Include protein in each meal and snack.  Choose whole grains instead of products that are made with refined flour.  Eat a variety of foods.  Exercise regularly as told by your health care provider. Aim to do 30 or more minutes of exercise on most days of the week.  If you are overweight or obese: ? Pay attention to how many calories you eat. Cutting down on calories can help you lose weight. ? Work with your health care provider or a diet and nutrition specialist (dietitian) to figure out how many calories you need each day. What foods can I eat?  Fruits Include a variety of colors and types. All fruits are helpful for PCOS. Vegetables Include a variety of colors and types. All vegetables are helpful for PCOS. Grains Whole grains, such as whole wheat. Whole-grain breads, crackers, cereals, and pasta. Unsweetened oatmeal, bulgur, barley, quinoa, and brown rice. Tortillas made from corn or whole-wheat flour. Meats and other proteins Low-fat (lean) proteins, such as fish, chicken, beans, eggs, and tofu. Dairy Low-fat dairy products, such as skim milk,  cheese sticks, and yogurt. Beverages Low-fat or fat-free drinks, such as water, low-fat milk, sugar-free drinks, and small amounts of 100% fruit juice. Seasonings and condiments Ketchup. Mustard. Barbecue sauce. Relish. Low-fat or fat-free mayonnaise. Fats and oils Olive oil or canola oil. Walnuts and almonds. The items listed above may not be a complete list of recommended foods and beverages. Contact a dietitian for more options. What foods are not recommended? Foods that are high in calories or fat. Fried foods. Sweets. Products that are made from refined white flour, including white bread, pastries, white rice, and pasta. The items listed above may not be a complete list of foods and beverages to avoid. Contact a dietitian for more information. Summary  PCOS is a hormonal imbalance that affects a woman's reproductive system.  You can help to manage your PCOS by exercising regularly and eating a healthy, varied diet of vegetables, fruit, whole grains, low-fat (lean) protein, and low-fat dairy products.  Changing what you eat can improve the way that your body uses insulin, help your hormones reach normal levels, and help you lose weight. This information is not intended to replace advice given to you by your health care provider. Make sure you discuss any questions you have with your health care provider. Document Released: 07/11/2015 Document Revised: 01/21/2017 Document Reviewed: 01/21/2017 Elsevier Interactive Patient Education  2019 [employer] Inc[employer] Polycystic Ovarian Syndrome  Polycystic ovarian syndrome (PCOS) is a common hormonal disorder among women of reproductive age. In most women with PCOS, many small fluid-filled sacs (cysts) grow on the ovaries, and the cysts are not part of  a normal menstrual cycle. PCOS can cause problems with your menstrual periods and make it difficult to get pregnant. It can also cause an increased risk of miscarriage with pregnancy. If it is not treated,  PCOS can lead to serious health problems, such as diabetes and heart disease. What are the causes? The cause of PCOS is not known, but it may be the result of a combination of certain factors, such as:  Irregular menstrual cycle.  High levels of certain hormones (androgens).  Problems with the hormone that helps to control blood sugar (insulin resistance).  Certain genes. What increases the risk? This condition is more likely to develop in women who have a family history of PCOS. What are the signs or symptoms? Symptoms of PCOS may include:  Multiple ovarian cysts.  Infrequent periods or no periods.  Periods that are too frequent or too heavy.  Unpredictable periods.  Inability to get pregnant (infertility) because of not ovulating.  Increased growth of hair on the face, chest, stomach, back, thumbs, thighs, or toes.  Acne or oily skin. Acne may develop during adulthood, and it may not respond to treatment.  Pelvic pain.  Weight gain or obesity.  Patches of thickened and dark brown or black skin on the neck, arms, breasts, or thighs (acanthosis nigricans).  Excess hair growth on the face, chest, abdomen, or upper thighs (hirsutism). How is this diagnosed? This condition is diagnosed based on:  Your medical history.  A physical exam, including a pelvic exam. Your health care provider may look for areas of increased hair growth on your skin.  Tests, such as: ? Ultrasound. This may be used to examine the ovaries and the lining of the uterus (endometrium) for cysts. ? Blood tests. These may be used to check levels of sugar (glucose), female hormone (testosterone), and female hormones (estrogen and progesterone) in your blood. How is this treated? There is no cure for PCOS, but treatment can help to manage symptoms and prevent more health problems from developing. Treatment varies depending on:  Your symptoms.  Whether you want to have a baby or whether you need birth  control (contraception). Treatment may include nutrition and lifestyle changes along with:  Progesterone hormone to start a menstrual period.  Birth control pills to help you have regular menstrual periods.  Medicines to make you ovulate, if you want to get pregnant.  Medicine to reduce excessive hair growth.  Surgery, in severe cases. This may involve making small holes in one or both of your ovaries. This decreases the amount of testosterone that your body produces. Follow these instructions at home:  Take over-the-counter and prescription medicines only as told by your health care provider.  Follow a healthy meal plan. This can help you reduce the effects of PCOS. ? Eat a healthy diet that includes lean proteins, complex carbohydrates, fresh fruits and vegetables, low-fat dairy products, and healthy fats. Make sure to eat enough fiber.  If you are overweight, lose weight as told by your health care provider. ? Losing 10% of your body weight may improve symptoms. ? Your health care provider can determine how much weight loss is best for you and can help you lose weight safely.  Keep all follow-up visits as told by your health care provider. This is important. Contact a health care provider if:  Your symptoms do not get better with medicine.  You develop new symptoms. This information is not intended to replace advice given to you by your health care  provider. Make sure you discuss any questions you have with your health care provider. Document Released: 07/13/2004 Document Revised: 11/15/2015 Document Reviewed: 09/04/2015 Elsevier Interactive Patient Education  2019 [employer].

## 2018-04-03 LAB — CYTOLOGY - PAP
DIAGNOSIS: NEGATIVE
HPV: NOT DETECTED

## 2018-04-25 ENCOUNTER — Encounter: Payer: BLUE CROSS/BLUE SHIELD | Admitting: Family Medicine

## 2018-04-26 ENCOUNTER — Ambulatory Visit (HOSPITAL_COMMUNITY)
Admission: EM | Admit: 2018-04-26 | Discharge: 2018-04-26 | Disposition: A | Discharge status: Home / Self Care | Payer: BLUE CROSS/BLUE SHIELD | Attending: Radiology | Admitting: Radiology

## 2018-04-26 ENCOUNTER — Other Ambulatory Visit: Payer: Self-pay

## 2018-04-26 ENCOUNTER — Encounter (HOSPITAL_COMMUNITY): Payer: Self-pay | Admitting: Emergency Medicine

## 2018-04-26 DIAGNOSIS — J029 Acute pharyngitis, unspecified: Secondary | ICD-10-CM | POA: Insufficient documentation

## 2018-04-26 LAB — POCT RAPID STREP A: Streptococcus, Group A Screen (Direct): NEGATIVE

## 2018-04-26 MED ORDER — FAMOTIDINE 20 MG PO TABS: 20.0000 mg | ORAL_TABLET | Freq: Two times a day (BID) | ORAL | 0 refills | Status: DC

## 2018-04-26 NOTE — ED Provider Notes (Addendum)
[place]    CSN: [id number] Arrival date & time: 04/26/18  1331     History   Chief Complaint Chief Complaint  Patient presents with  . Sore Throat    HPI COURTNY BENNISON is a 33 y.o. female.   33 year old female presents with dysphagia x1 month.  Patient states that she originally had issues with swallowing pills.  Approximately 2 weeks ago she was eating shrimp when she felt difficulty swallowing again.  Patient states that she took Benadryl and felt her throat relax.  However since then she has had difficulty swallowing all foods.  Patient states that when she puts her hand down her throat and she "feels something there."  Patient is very tearful throughout evaluation.  Condition is acute in nature.  Condition is made better by nothing.  Eating and swallowing pills.  Condition is made worse by nothing.  She denies any known trauma or issues with eating.  Patient points to right supraclavicular area as the point of pain.  Patient has a known acid reflux.  Patient's airway is intact patient is able to speak in full sentences with no respiratory distress noted.     Past Medical History:  Diagnosis Date  . Abnormal Pap smear 2012  . Preeclampsia     Patient Active Problem List   Diagnosis Date Noted  . ASCUS with positive high risk HPV cervical 03/28/2018  . TOE PAIN 03/01/2010  . SHIN SPLINTS 01/05/2010  . LEG CRAMPS 11/30/2009  . Anemia 07/04/2009  . TINEA CORPORIS 06/30/2009  . HEADACHE, TENSION 09/06/2008  . 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 Abnormal Pap smear of cervix 05/06/2008  . Morbid obesity ([place]) 04/09/2008  . INTERNAL HEMORRHOIDS 04/09/2008  . HELICOBACTER PYLORI INFECTION, HX OF 04/09/2008    Past Surgical History:  Procedure Laterality Date  . CESAREAN SECTION  2005  . COLPOSCOPY  2012    OB History    Gravida  2   Para  1   Term  0   Preterm  1   AB  1   Living  1     SAB  0   TAB  1   Ectopic  0   Multiple  0   Live Births                 Home Medications    Prior to Admission medications   Not on File    Family History Family History  Problem Relation Age of Onset  . Hypertension Mother   . Diabetes Mother   . Hypertension Father     Social History Social History   Tobacco Use  . Smoking status: Never Smoker  . Smokeless tobacco: Never Used  Substance Use Topics  . Alcohol use: No  . Drug use: No     Allergies   Codeine   Review of Systems Review of Systems  Constitutional: Negative for chills and fever.  HENT: Positive for sore throat ( Pain with swallowing). Negative for ear pain.   Eyes: Negative for pain and visual disturbance.  Respiratory: Negative for cough and shortness of breath.   Cardiovascular: Negative for chest pain and palpitations.  Gastrointestinal: Negative for abdominal pain and vomiting.  Genitourinary: Negative for dysuria and hematuria.  Musculoskeletal: Negative for arthralgias and back pain.  Skin: Negative for color change and rash.  Neurological: Negative for seizures and syncope.  All other systems reviewed and are negative.    Physical Exam Triage Vital Signs ED Triage  Vitals  Enc Vitals Group     BP 04/26/18 1426 120/61     Pulse Rate 04/26/18 1426 63     Resp 04/26/18 1426 20     Temp 04/26/18 1426 98 F (36.7 C)     Temp Source 04/26/18 1426 Oral     SpO2 04/26/18 1426 100 %     Weight --      Height --      Head Circumference --      Peak Flow --      Pain Score 04/26/18 1424 0     Pain Loc --      Pain Edu? --      Excl. in [place]? --    No data found.  Updated Vital Signs BP 120/61 (BP Location: Right Arm)   Pulse 63   Temp 98 F (36.7 C) (Oral)   Resp 20   LMP 04/06/2018 (Exact Date)   SpO2 100%   Visual Acuity Right Eye Distance:   Left Eye Distance:   Bilateral Distance:    Right Eye Near:   Left Eye Near:    Bilateral Near:     Physical Exam Vitals signs and nursing note reviewed.  Constitutional:       Appearance: She is well-developed.  HENT:     Head: Normocephalic and atraumatic.     Right Ear: Tympanic membrane normal.     Left Ear: Tympanic membrane normal.     Mouth/Throat:     Mouth: Mucous membranes are moist.     Pharynx: Posterior oropharyngeal erythema ( Nonslurred) present.  Eyes:     Conjunctiva/sclera: Conjunctivae normal.  Neck:     Musculoskeletal: Normal range of motion.  Pulmonary:     Effort: Pulmonary effort is normal.  Skin:    General: Skin is warm and dry.  Neurological:     Mental Status: She is alert and oriented to person, place, and time.      UC Treatments / Results  Labs (all labs ordered are listed, but only abnormal results are displayed) Labs Reviewed - No data to display  EKG None  Radiology No results found.  Procedures Procedures (including critical care time)  Medications Ordered in UC Medications - No data to display  Initial Impression / Assessment and Plan / UC Course  I have reviewed the triage vital signs and the nursing notes.  Pertinent labs & imaging results that were available during my care of the patient were reviewed by me and considered in my medical decision making (see chart for details).      Final Clinical Impressions(s) / UC Diagnoses   Final diagnoses:  None   Discharge Instructions   None    ED Prescriptions    None     Controlled Substance Prescriptions [place] Controlled Substance Registry consulted? Not Applicable   Jacqualine Mau, NP 04/26/18 1536    Jacqualine Mau, NP 04/26/18 1556

## 2018-04-26 NOTE — ED Triage Notes (Signed)
The patient presented to the [facility] with a complaint of a sore throat x 2 weeks.

## 2018-04-27 ENCOUNTER — Other Ambulatory Visit: Payer: Self-pay

## 2018-04-27 ENCOUNTER — Emergency Department (HOSPITAL_COMMUNITY)
Admission: EM | Admit: 2018-04-27 | Discharge: 2018-04-28 | Disposition: A | Discharge status: Home / Self Care | Payer: BLUE CROSS/BLUE SHIELD | Attending: Emergency Medicine | Admitting: Emergency Medicine

## 2018-04-27 DIAGNOSIS — D649 Anemia, unspecified: Secondary | ICD-10-CM | POA: Diagnosis not present

## 2018-04-27 DIAGNOSIS — R1310 Dysphagia, unspecified: Secondary | ICD-10-CM | POA: Diagnosis not present

## 2018-04-27 DIAGNOSIS — J029 Acute pharyngitis, unspecified: Secondary | ICD-10-CM | POA: Diagnosis present

## 2018-04-27 NOTE — ED Triage Notes (Signed)
Pt reports a sore throat for almost a month now, states she is also having difficulty swallowing. She was seen at [facility] yesterday and states they said it was acid reflux. Pt came here due to no improvement in her swallowing. Pt states "I know I got some stomach issues going on"

## 2018-04-28 ENCOUNTER — Emergency Department (HOSPITAL_COMMUNITY): Payer: BLUE CROSS/BLUE SHIELD

## 2018-04-28 LAB — BASIC METABOLIC PANEL
ANION GAP: 9 (ref 5–15)
BUN: 14 mg/dL (ref 6–20)
CO2: 25 mmol/L (ref 22–32)
Calcium: 8.5 mg/dL — ABNORMAL LOW (ref 8.9–10.3)
Chloride: 102 mmol/L (ref 98–111)
Creatinine, Ser: 0.82 mg/dL (ref 0.44–1.00)
GFR calc Af Amer: >60 mL/min (ref >60)
GFR calc non Af Amer: >60 mL/min (ref >60)
GLUCOSE: 93 mg/dL (ref 70–99)
POTASSIUM: 3.6 mmol/L (ref 3.5–5.1)
Sodium: 136 mmol/L (ref 135–145)

## 2018-04-28 LAB — I-STAT BETA HCG BLOOD, ED (MC, WL, AP ONLY)

## 2018-04-28 LAB — CBC WITH DIFFERENTIAL/PLATELET
ABS IMMATURE GRANULOCYTES: 0.06 10*3/uL (ref 0.00–0.07)
Basophils Absolute: 0 10*3/uL (ref 0.0–0.1)
Basophils Relative: 0 %
Eosinophils Absolute: 0.2 10*3/uL (ref 0.0–0.5)
Eosinophils Relative: 1 %
HCT: 33.2 % — ABNORMAL LOW (ref 36.0–46.0)
HEMOGLOBIN: 8.7 g/dL — AB (ref 12.0–15.0)
Immature Granulocytes: 1 %
LYMPHS ABS: 2.8 10*3/uL (ref 0.7–4.0)
Lymphocytes Relative: 23 %
MCH: 16.9 pg — ABNORMAL LOW (ref 26.0–34.0)
MCHC: 26.2 g/dL — ABNORMAL LOW (ref 30.0–36.0)
MCV: 64.3 fL — ABNORMAL LOW (ref 80.0–100.0)
MONOS PCT: 4 %
Monocytes Absolute: 0.5 10*3/uL (ref 0.1–1.0)
NEUTROS ABS: 9 10*3/uL — AB (ref 1.7–7.7)
Neutrophils Relative %: 71 %
Platelets: 509 10*3/uL — ABNORMAL HIGH (ref 150–400)
RBC: 5.16 MIL/uL — ABNORMAL HIGH (ref 3.87–5.11)
RDW: 22.2 % — ABNORMAL HIGH (ref 11.5–15.5)
WBC: 12.6 10*3/uL — ABNORMAL HIGH (ref 4.0–10.5)
nRBC: 0 % (ref 0.0–0.2)

## 2018-04-28 LAB — GROUP A STREP BY PCR: Group A Strep by PCR: NOT DETECTED

## 2018-04-28 MED ORDER — FERROUS SULFATE 325 (65 FE) MG PO TABS: 325.0000 mg | ORAL_TABLET | Freq: Every day | ORAL | 0 refills | Status: AC

## 2018-04-28 MED ORDER — ALUM & MAG HYDROXIDE-SIMETH 200-200-20 MG/5ML PO SUSP
30.0000 mL | Freq: Once | ORAL | Status: AC
  Administered 2018-04-28: 30 mL via ORAL
  Filled 2018-04-28: qty 30

## 2018-04-28 MED ORDER — IOHEXOL 300 MG/ML  SOLN
75.0000 mL | Freq: Once | INTRAMUSCULAR | Status: AC | PRN
  Administered 2018-04-28: 75 mL via INTRAVENOUS

## 2018-04-28 MED ORDER — SODIUM CHLORIDE 0.9 % IV BOLUS
1000.0000 mL | Freq: Once | INTRAVENOUS | Status: AC
  Administered 2018-04-28: 1000 mL via INTRAVENOUS

## 2018-04-28 MED ORDER — LIDOCAINE VISCOUS HCL 2 % MT SOLN
15.0000 mL | Freq: Once | OROMUCOSAL | Status: AC
  Administered 2018-04-28: 15 mL via ORAL
  Filled 2018-04-28: qty 15

## 2018-04-28 NOTE — ED Notes (Signed)
Patient verbalizes understanding of discharge instructions. Opportunity for questioning and answers were provided. Armband removed by staff, pt discharged from ED home via POV.  

## 2018-04-28 NOTE — ED Provider Notes (Signed)
[place] EMERGENCY DEPARTMENT Provider Note   CSN: [id number] Arrival date & time: 04/27/18  1942     History   Chief Complaint Chief Complaint  Patient presents with  . Sore Throat  . Dysphagia    HPI Michaela Carrillo is a 33 y.o. female.  Pt presents to the ED today with sore throat and difficulty swallowing.  Pt said she's had sx for 1 month and it is getting worse.  She feels like food gets "hung" when she swallows.  She was seen at urgent care yesterday and was told it was GERD.     Past Medical History:  Diagnosis Date  . Abnormal Pap smear 2012  . Preeclampsia     Patient Active Problem List   Diagnosis Date Noted  . ASCUS with positive high risk HPV cervical 03/28/2018  . TOE PAIN 03/01/2010  . SHIN SPLINTS 01/05/2010  . LEG CRAMPS 11/30/2009  . Anemia 07/04/2009  . TINEA CORPORIS 06/30/2009  . HEADACHE, TENSION 09/06/2008  . 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 Abnormal Pap smear of cervix 05/06/2008  . Morbid obesity ([place]) 04/09/2008  . INTERNAL HEMORRHOIDS 04/09/2008  . HELICOBACTER PYLORI INFECTION, HX OF 04/09/2008    Past Surgical History:  Procedure Laterality Date  . CESAREAN SECTION  2005  . COLPOSCOPY  2012     OB History    Gravida  2   Para  1   Term  0   Preterm  1   AB  1   Living  1     SAB  0   TAB  1   Ectopic  0   Multiple  0   Live Births               Home Medications    Prior to Admission medications   Medication Sig Start Date End Date Taking? Authorizing Provider  famotidine (PEPCID) 20 MG tablet Take 1 tablet (20 mg total) by mouth 2 (two) times daily. 04/26/18  Yes Jacqualine Mau, NP  ferrous sulfate 325 (65 FE) MG tablet Take 1 tablet (325 mg total) by mouth daily. 04/28/18   Isla Pence, MD    Family History Family History  Problem Relation Age of Onset  . Hypertension Mother   . Diabetes Mother   . Hypertension Father     Social History Social History   Tobacco Use   . Smoking status: Never Smoker  . Smokeless tobacco: Never Used  Substance Use Topics  . Alcohol use: No  . Drug use: No     Allergies   Codeine   Review of Systems Review of Systems  HENT: Positive for sore throat and trouble swallowing.   All other systems reviewed and are negative.    Physical Exam Updated Vital Signs BP (!) 107/48 (BP Location: Right Arm)   Pulse 74   Temp 98.5 F (36.9 C) (Oral)   Resp 16   LMP 04/06/2018 (Exact Date)   SpO2 100%   Physical Exam Vitals signs and nursing note reviewed.  Constitutional:      Appearance: She is well-developed. She is obese.  HENT:     Head: Normocephalic and atraumatic.     Right Ear: Ear canal normal.     Left Ear: Ear canal normal.     Mouth/Throat:     Mouth: Mucous membranes are dry.     Pharynx: Oropharynx is clear. No pharyngeal swelling or posterior oropharyngeal erythema.     Tonsils: No  tonsillar exudate or tonsillar abscesses.  Eyes:     Conjunctiva/sclera: Conjunctivae normal.  Neck:     Musculoskeletal: Normal range of motion.  Cardiovascular:     Rate and Rhythm: Normal rate and regular rhythm.  Pulmonary:     Effort: Pulmonary effort is normal.     Breath sounds: Normal breath sounds.  Abdominal:     General: Bowel sounds are normal.     Palpations: Abdomen is soft.  Skin:    General: Skin is warm.     Capillary Refill: Capillary refill takes less than 2 seconds.  Neurological:     General: No focal deficit present.     Mental Status: She is alert and oriented to person, place, and time.  Psychiatric:        Mood and Affect: Mood normal.        Behavior: Behavior normal.      ED Treatments / Results  Labs (all labs ordered are listed, but only abnormal results are displayed) Labs Reviewed  BASIC METABOLIC PANEL - Abnormal; Notable for the following components:      Result Value   Calcium 8.5 (*)    All other components within normal limits  CBC WITH DIFFERENTIAL/PLATELET -  Abnormal; Notable for the following components:   WBC 12.6 (*)    RBC 5.16 (*)    Hemoglobin 8.7 (*)    HCT 33.2 (*)    MCV 64.3 (*)    MCH 16.9 (*)    MCHC 26.2 (*)    RDW 22.2 (*)    Platelets 509 (*)    Neutro Abs 9.0 (*)    All other components within normal limits  GROUP A STREP BY PCR  I-STAT BETA HCG BLOOD, ED ([facility], [facility], [facility] ONLY)    EKG None  Radiology Ct Soft Tissue Neck W Contrast  Result Date: 04/28/2018 CLINICAL DATA:  Sore throat for 1 month. Acid reflux. Difficulty swallowing. EXAM: CT NECK WITH CONTRAST TECHNIQUE: Multidetector CT imaging of the neck was performed using the standard protocol following the bolus administration of intravenous contrast. CONTRAST:  36mL OMNIPAQUE IOHEXOL 300 MG/ML  SOLN COMPARISON:  None. FINDINGS: PHARYNX AND LARYNX: --Nasopharynx: Fossae of Rosenmuller are clear. Normal adenoid tonsils for age. --Oral cavity and oropharynx: The palatine and lingual tonsils are normal. The visible oral cavity and floor of mouth are normal. --Hypopharynx: Normal vallecula and pyriform sinuses. --Larynx: Normal epiglottis and pre-epiglottic space. Normal aryepiglottic and vocal folds. --Retropharyngeal space: No abscess, effusion or lymphadenopathy. SALIVARY GLANDS: --Parotid: No mass lesion or inflammation. No sialolithiasis or ductal dilatation. --Submandibular: Symmetric without inflammation. No sialolithiasis or ductal dilatation. --Sublingual: Normal. No ranula or other visible lesion of the base of tongue and floor of mouth. THYROID: Normal. LYMPH NODES: No enlarged or abnormal density lymph nodes. VASCULAR: Major cervical vessels are patent. LIMITED INTRACRANIAL: Normal. VISUALIZED ORBITS: Normal. MASTOIDS AND VISUALIZED PARANASAL SINUSES: No fluid levels or advanced mucosal thickening. No mastoid effusion. SKELETON: No bony spinal canal stenosis. No lytic or blastic lesions. UPPER CHEST: Clear. OTHER: None. IMPRESSION: Normal CT of the neck. Electronically Signed    By: Ulyses Jarred M.D.   On: 04/28/2018 02:18    Procedures Procedures (including critical care time)  Medications Ordered in ED Medications  sodium chloride 0.9 % bolus 1,000 mL (1,000 mLs Intravenous New Bag/Given 04/28/18 0107)  alum & mag hydroxide-simeth (MAALOX/MYLANTA) 200-200-20 MG/5ML suspension 30 mL (30 mLs Oral Given 04/28/18 0059)    And  lidocaine (XYLOCAINE) 2 %  viscous mouth solution 15 mL (15 mLs Oral Given 04/28/18 0059)  iohexol (OMNIPAQUE) 300 MG/ML solution 75 mL (75 mLs Intravenous Contrast Given 04/28/18 0154)     Initial Impression / Assessment and Plan / ED Course  I have reviewed the triage vital signs and the nursing notes.  Pertinent labs & imaging results that were available during my care of the patient were reviewed by me and considered in my medical decision making (see chart for details).    Pt is feeling better after the GI cocktail.  She may have an esophageal stricture which is causing her dysphagia.  She is told to continue taking the pepcid which was started on 1/25.  Amb referral to GI given to patient.  Pt is also anemic, which she's been for years.  She is told to restart taking iron.    Return if worse.  Final Clinical Impressions(s) / ED Diagnoses   Final diagnoses:  Dysphagia, unspecified type  Anemia, unspecified type    ED Discharge Orders         Ordered    Ambulatory referral to Gastroenterology     04/28/18 0229    ferrous sulfate 325 (65 FE) MG tablet  Daily     04/28/18 0229           Isla Pence, MD 04/28/18 [phone]

## 2018-04-28 NOTE — Discharge Instructions (Addendum)
Continue to take pepcid.

## 2018-04-28 NOTE — ED Notes (Signed)
Updated on wait for treatment room. 

## 2018-04-28 NOTE — ED Notes (Signed)
Pt reports trouble swallowing for a month or so now. Pt states she feels as though something is stuck.

## 2018-05-01 ENCOUNTER — Ambulatory Visit: Payer: BLUE CROSS/BLUE SHIELD | Admitting: Gastroenterology

## 2018-05-12 ENCOUNTER — Ambulatory Visit: Payer: BLUE CROSS/BLUE SHIELD | Admitting: Internal Medicine

## 2018-11-28 ENCOUNTER — Other Ambulatory Visit: Payer: Self-pay

## 2018-11-28 DIAGNOSIS — Z20822 Contact with and (suspected) exposure to covid-19: Secondary | ICD-10-CM

## 2018-11-29 LAB — NOVEL CORONAVIRUS, NAA: SARS-CoV-2, NAA: NOT DETECTED

## 2019-01-21 ENCOUNTER — Other Ambulatory Visit: Payer: Self-pay

## 2019-01-21 DIAGNOSIS — Z20822 Contact with and (suspected) exposure to covid-19: Secondary | ICD-10-CM

## 2019-01-23 LAB — NOVEL CORONAVIRUS, NAA: SARS-CoV-2, NAA: NOT DETECTED

## 2019-11-20 ENCOUNTER — Other Ambulatory Visit: Payer: BLUE CROSS/BLUE SHIELD

## 2019-11-20 ENCOUNTER — Other Ambulatory Visit: Payer: Self-pay

## 2019-11-20 DIAGNOSIS — Z20822 Contact with and (suspected) exposure to covid-19: Secondary | ICD-10-CM

## 2019-11-21 LAB — SARS-COV-2, NAA 2 DAY TAT

## 2019-11-21 LAB — NOVEL CORONAVIRUS, NAA: SARS-CoV-2, NAA: DETECTED — AB

## 2019-11-22 ENCOUNTER — Telehealth (HOSPITAL_COMMUNITY): Payer: Self-pay | Admitting: Nurse Practitioner

## 2019-11-22 ENCOUNTER — Encounter: Payer: Self-pay | Admitting: Nurse Practitioner

## 2019-11-22 DIAGNOSIS — U071 COVID-19: Secondary | ICD-10-CM

## 2019-11-22 NOTE — Telephone Encounter (Signed)
Called to Discuss with patient about Covid symptoms and the use of regeneron, a monoclonal antibody infusion for those with mild to moderate Covid symptoms and at a high risk of hospitalization.     Pt is qualified for this infusion at the [facility] due to co-morbid conditions and/or a member of an at-risk group.     Unable to reach pt. Left message to return call. Sent mychart message.   Willford Rabideau, DNP, AGNP-C [phone] (Infusion Center Hotline)  

## 2019-11-23 ENCOUNTER — Other Ambulatory Visit: Payer: BLUE CROSS/BLUE SHIELD

## 2020-04-21 ENCOUNTER — Encounter (HOSPITAL_BASED_OUTPATIENT_CLINIC_OR_DEPARTMENT_OTHER): Payer: Self-pay | Admitting: *Deleted

## 2020-04-21 ENCOUNTER — Emergency Department (HOSPITAL_BASED_OUTPATIENT_CLINIC_OR_DEPARTMENT_OTHER)
Admission: EM | Admit: 2020-04-21 | Discharge: 2020-04-21 | Disposition: A | Discharge status: Home / Self Care | Payer: PRIVATE HEALTH INSURANCE | Attending: Emergency Medicine | Admitting: Emergency Medicine

## 2020-04-21 ENCOUNTER — Emergency Department (HOSPITAL_BASED_OUTPATIENT_CLINIC_OR_DEPARTMENT_OTHER): Payer: PRIVATE HEALTH INSURANCE

## 2020-04-21 ENCOUNTER — Other Ambulatory Visit: Payer: Self-pay

## 2020-04-21 DIAGNOSIS — M25511 Pain in right shoulder: Secondary | ICD-10-CM | POA: Insufficient documentation

## 2020-04-21 DIAGNOSIS — Y990 Civilian activity done for income or pay: Secondary | ICD-10-CM | POA: Diagnosis not present

## 2020-04-21 DIAGNOSIS — M545 Low back pain, unspecified: Secondary | ICD-10-CM | POA: Diagnosis not present

## 2020-04-21 DIAGNOSIS — M25512 Pain in left shoulder: Secondary | ICD-10-CM | POA: Diagnosis not present

## 2020-04-21 DIAGNOSIS — W009XXA Unspecified fall due to ice and snow, initial encounter: Secondary | ICD-10-CM | POA: Diagnosis not present

## 2020-04-21 DIAGNOSIS — S39012A Strain of muscle, fascia and tendon of lower back, initial encounter: Secondary | ICD-10-CM

## 2020-04-21 DIAGNOSIS — S9031XA Contusion of right foot, initial encounter: Secondary | ICD-10-CM | POA: Diagnosis not present

## 2020-04-21 DIAGNOSIS — S99921A Unspecified injury of right foot, initial encounter: Secondary | ICD-10-CM | POA: Diagnosis present

## 2020-04-21 DIAGNOSIS — Y9301 Activity, walking, marching and hiking: Secondary | ICD-10-CM | POA: Insufficient documentation

## 2020-04-21 DIAGNOSIS — R102 Pelvic and perineal pain: Secondary | ICD-10-CM | POA: Diagnosis not present

## 2020-04-21 MED ORDER — METHOCARBAMOL 500 MG PO TABS: 500.0000 mg | ORAL_TABLET | Freq: Two times a day (BID) | ORAL | 0 refills | Status: AC

## 2020-04-21 MED ORDER — LIDOCAINE 5 % EX PTCH: 1.0000 | MEDICATED_PATCH | CUTANEOUS | 0 refills | Status: AC

## 2020-04-21 MED ORDER — IBUPROFEN 800 MG PO TABS
800.0000 mg | ORAL_TABLET | Freq: Once | ORAL | Status: AC
  Administered 2020-04-21: 800 mg via ORAL
  Filled 2020-04-21: qty 1

## 2020-04-21 MED ORDER — IBUPROFEN 800 MG PO TABS: 800.0000 mg | ORAL_TABLET | Freq: Three times a day (TID) | ORAL | 0 refills | Status: AC

## 2020-04-21 NOTE — ED Notes (Signed)
Pt. Michaela Carrillo she works at [employer] main [employer]

## 2020-04-21 NOTE — ED Triage Notes (Signed)
She slipped on ice and fell at work yesterday. Back pain since the fall. Workman's comp.

## 2020-04-21 NOTE — ED Notes (Signed)
Pt. Had no hit to her head.

## 2020-04-21 NOTE — Discharge Instructions (Signed)
You likely had a slipped disks in your back that is causing your pain  Please take ibuprofen for pain  Please take Robaxin for muscle spasms.  Use Lidoderm patch for your back  See neurosurgery for follow-up  Return to ER if you have worse back pain, numbness, weakness

## 2020-04-21 NOTE — ED Notes (Signed)
Pt. Reports she had a fall on concrete yesterday injuring her back and her R ankle.  Pt walked to room and is able to walk. Pt. Reports her back is sore all over in the upper back and lower back.  Pt. Said she fell on concrete.  Pt. Reports the R foot and the R ankle are sore.  Pt. Is walking on the foot.  Pt. Fell yesterday at 1850 pm.

## 2020-04-21 NOTE — ED Provider Notes (Signed)
[facility] Provider Note   CSN: [id number] Arrival date & time: 04/21/20  1720     History No chief complaint on file.   Michaela Carrillo is a 35 y.o. female here presenting with fall.  Patient is a Quarry manager at [employer].  She states that she was walking into work yesterday and slipped on ice and fell backwards.  She states that she injured her right foot as well as her buttock.  She also has lower back pain as well.  She states that she also hit her shoulder blades but had no head injury.  She has been taking Tylenol with no relief.  Denies passing out.  Denies any medical problems or fever or chills or cough  The history is provided by the patient.       Past Medical History:  Diagnosis Date  . Abnormal Pap smear 2012  . Preeclampsia     Patient Active Problem List   Diagnosis Date Noted  . ASCUS with positive high risk HPV cervical 03/28/2018  . TOE PAIN 03/01/2010  . SHIN SPLINTS 01/05/2010  . LEG CRAMPS 11/30/2009  . Anemia 07/04/2009  . TINEA CORPORIS 06/30/2009  . HEADACHE, TENSION 09/06/2008  . 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 Abnormal Pap smear of cervix 05/06/2008  . Morbid obesity ([place]) 04/09/2008  . INTERNAL HEMORRHOIDS 04/09/2008  . HELICOBACTER PYLORI INFECTION, HX OF 04/09/2008    Past Surgical History:  Procedure Laterality Date  . CESAREAN SECTION  2005  . COLPOSCOPY  2012     OB History    Gravida  2   Para  1   Term  0   Preterm  1   AB  1   Living  1     SAB  0   IAB  1   Ectopic  0   Multiple  0   Live Births              Family History  Problem Relation Age of Onset  . Hypertension Mother   . Diabetes Mother   . Hypertension Father     Social History   Tobacco Use  . Smoking status: Never Smoker  . Smokeless tobacco: Never Used  Substance Use Topics  . Alcohol use: No  . Drug use: No    Home Medications Prior to Admission medications   Medication Sig Start Date End Date Taking? Authorizing  Provider  ferrous sulfate 325 (65 FE) MG tablet Take 1 tablet (325 mg total) by mouth daily. 04/28/18  Yes Isla Pence, MD  omeprazole (PRILOSEC) 40 MG capsule Take 40 mg by mouth daily.   Yes [provider]  famotidine (PEPCID) 20 MG tablet Take 1 tablet (20 mg total) by mouth 2 (two) times daily. 04/26/18   Jacqualine Mau, NP    Allergies    Codeine  Review of Systems   Review of Systems  Musculoskeletal: Positive for back pain.       R foot pain   All other systems reviewed and are negative.   Physical Exam Updated Vital Signs BP (!) 100/53 (BP Location: Right Arm)   Pulse 77   Temp 98.6 F (37 C) (Oral)   Resp 18   Ht 5\' 5"  (1.651 m)   Wt (!) 142 kg   LMP 04/05/2020   SpO2 100%   BMI 52.09 kg/m   Physical Exam Vitals and nursing note reviewed.  Constitutional:      Comments: Uncomfortable   HENT:  Head: Normocephalic.     Comments: No obvious scalp hematoma or laceration    Nose: Nose normal.     Mouth/Throat:     Mouth: Mucous membranes are moist.  Eyes:     Extraocular Movements: Extraocular movements intact.     Pupils: Pupils are equal, round, and reactive to light.  Neck:     Comments: No midline tenderness. Cardiovascular:     Rate and Rhythm: Normal rate and regular rhythm.     Pulses: Normal pulses.     Heart sounds: Normal heart sounds.  Pulmonary:     Effort: Pulmonary effort is normal.     Breath sounds: Normal breath sounds.  Abdominal:     General: Abdomen is flat.     Palpations: Abdomen is soft.  Musculoskeletal:     Cervical back: Normal range of motion and neck supple.     Comments: Mild bilateral scapular tenderness with no obvious deformity.  Mild diffuse lower lumbar tenderness.  Also has SI joint tenderness bilaterally.  Patient has normal range of motion bilateral hips.  Patient does have some bruising on the dorsal aspect of the right foot.  No obvious ankle or tib-fib or knee tenderness   Skin:    Capillary  Refill: Capillary refill takes less than 2 seconds.  Neurological:     General: No focal deficit present.     Mental Status: She is oriented to person, place, and time.     Comments: Negative straight leg raise bilaterally and normal reflexes bilateral leg  Psychiatric:        Mood and Affect: Mood normal.        Behavior: Behavior normal.     ED Results / Procedures / Treatments   Labs (all labs ordered are listed, but only abnormal results are displayed) Labs Reviewed  PREGNANCY, URINE    EKG None  Radiology DG Chest 2 View  Result Date: 04/21/2020 CLINICAL DATA:  Status post fall. EXAM: CHEST - 2 VIEW COMPARISON:  May 06, 2003 FINDINGS: The heart size and mediastinal contours are within normal limits. Both lungs are clear. The visualized skeletal structures are unremarkable. IMPRESSION: No active cardiopulmonary disease. Electronically Signed   By: Virgina Norfolk M.D.   On: 04/21/2020 20:52   DG Lumbar Spine Complete  Result Date: 04/21/2020 CLINICAL DATA:  Status post fall. EXAM: LUMBAR SPINE - COMPLETE 4+ VIEW COMPARISON:  November 29, 2016 FINDINGS: There is no evidence of lumbar spine fracture. Approximately 8 mm anterolisthesis of the L4 vertebral body is noted on L5. This represents a new finding when compared to the prior study. Intervertebral disc spaces are maintained. IMPRESSION: Grade 1 anterolisthesis of the L4 vertebral body on L5, new when compared to the prior study. MRI correlation is recommended. Electronically Signed   By: Virgina Norfolk M.D.   On: 04/21/2020 20:54   DG Pelvis 1-2 Views  Result Date: 04/21/2020 CLINICAL DATA:  Status post fall. EXAM: PELVIS - 1-2 VIEW COMPARISON:  None. FINDINGS: There is no evidence of pelvic fracture or diastasis. No pelvic bone lesions are seen. IMPRESSION: Negative. Electronically Signed   By: Virgina Norfolk M.D.   On: 04/21/2020 20:54   DG Foot Complete Right  Result Date: 04/21/2020 CLINICAL DATA:  Slipped on  ice EXAM: RIGHT FOOT COMPLETE - 3+ VIEW COMPARISON:  Radiograph 03/13/2015 FINDINGS: No acute fracture or traumatic malalignment is seen. Mild hallux valgus deformity and slight pes planus may be present though incompletely assessed on nonweightbearing views. No  acute or conspicuous osseous lesions. 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 There is some mild lateral soft tissue swelling but without subjacent osseous abnormality. Normal bone mineralization. IMPRESSION: 1. No acute fracture or traumatic malalignment. 2. Mild lateral soft tissue swelling without subjacent osseous abnormality. 3. Mild hallux valgus deformity and slight pes planus may be present though incompletely assessed on nonweightbearing views. Electronically Signed   By: Lovena Le M.D.   On: 04/21/2020 20:58    Procedures Procedures (including critical care time)  Medications Ordered in ED Medications  ibuprofen (ADVIL) tablet 800 mg (800 mg Oral Given 04/21/20 2025)    ED Course  I have reviewed the triage vital signs and the nursing notes.  Pertinent labs & imaging results that were available during my care of the patient were reviewed by me and considered in my medical decision making (see chart for details).    MDM Rules/Calculators/A&P                          Michaela Carrillo is a 35 y.o. female here presenting with fall.  Patient has back pain and pelvic pain and right foot pain after fall yesterday.  I think likely muscle spasms.  Will get x-rays to rule out fracture.    9:45 PM Xray showed L4 to L5 anterolisthesis but no fracture. Will dc home with motrin, robaxin, lidocaine patch     Final Clinical Impression(s) / ED Diagnoses Final diagnoses:  None    Rx / DC Orders ED Discharge Orders    None       Drenda Freeze, MD 04/21/20 2146

## 2020-05-06 ENCOUNTER — Other Ambulatory Visit: Payer: Self-pay | Admitting: Orthopedic Surgery

## 2020-05-06 ENCOUNTER — Other Ambulatory Visit (HOSPITAL_COMMUNITY): Payer: Self-pay | Admitting: Orthopedic Surgery

## 2020-05-06 DIAGNOSIS — M545 Low back pain, unspecified: Secondary | ICD-10-CM

## 2020-05-25 ENCOUNTER — Ambulatory Visit (HOSPITAL_COMMUNITY): Admission: RE | Admit: 2020-05-25 | Payer: PRIVATE HEALTH INSURANCE | Source: Ambulatory Visit

## 2020-05-25 ENCOUNTER — Encounter (HOSPITAL_COMMUNITY): Payer: Self-pay

## 2020-05-29 ENCOUNTER — Other Ambulatory Visit: Payer: Self-pay

## 2020-05-29 ENCOUNTER — Ambulatory Visit (HOSPITAL_COMMUNITY)
Admission: RE | Admit: 2020-05-29 | Discharge: 2020-05-29 | Disposition: A | Discharge status: Home / Self Care | Payer: PRIVATE HEALTH INSURANCE | Source: Ambulatory Visit | Attending: Orthopedic Surgery | Admitting: Orthopedic Surgery

## 2020-05-29 DIAGNOSIS — M545 Low back pain, unspecified: Secondary | ICD-10-CM | POA: Insufficient documentation

## 2020-09-14 ENCOUNTER — Other Ambulatory Visit: Payer: Self-pay

## 2020-09-14 ENCOUNTER — Encounter (HOSPITAL_COMMUNITY): Payer: Self-pay | Admitting: Emergency Medicine

## 2020-09-14 ENCOUNTER — Emergency Department (HOSPITAL_COMMUNITY)
Admission: EM | Admit: 2020-09-14 | Discharge: 2020-09-15 | Disposition: A | Discharge status: Home / Self Care | Payer: BLUE CROSS/BLUE SHIELD | Attending: Emergency Medicine | Admitting: Emergency Medicine

## 2020-09-14 ENCOUNTER — Emergency Department (HOSPITAL_COMMUNITY): Payer: BLUE CROSS/BLUE SHIELD

## 2020-09-14 DIAGNOSIS — U071 COVID-19: Secondary | ICD-10-CM | POA: Diagnosis not present

## 2020-09-14 DIAGNOSIS — R079 Chest pain, unspecified: Secondary | ICD-10-CM | POA: Diagnosis present

## 2020-09-14 LAB — BASIC METABOLIC PANEL
Anion gap: 8 (ref 5–15)
BUN: 9 mg/dL (ref 6–20)
CO2: 26 mmol/L (ref 22–32)
Calcium: 9 mg/dL (ref 8.9–10.3)
Chloride: 100 mmol/L (ref 98–111)
Creatinine, Ser: 0.71 mg/dL (ref 0.44–1.00)
GFR, Estimated: >60 mL/min (ref >60)
Glucose, Bld: 98 mg/dL (ref 70–99)
Potassium: 4.1 mmol/L (ref 3.5–5.1)
Sodium: 134 mmol/L — ABNORMAL LOW (ref 135–145)

## 2020-09-14 LAB — CBC WITH DIFFERENTIAL/PLATELET
Abs Immature Granulocytes: 0.07 10*3/uL (ref 0.00–0.07)
Basophils Absolute: 0 10*3/uL (ref 0.0–0.1)
Basophils Relative: 1 %
Eosinophils Absolute: 0.2 10*3/uL (ref 0.0–0.5)
Eosinophils Relative: 3 %
HCT: 44.6 % (ref 36.0–46.0)
Hemoglobin: 13.6 g/dL (ref 12.0–15.0)
Immature Granulocytes: 1 %
Lymphocytes Relative: 19 %
Lymphs Abs: 1.6 10*3/uL (ref 0.7–4.0)
MCH: 25.4 pg — ABNORMAL LOW (ref 26.0–34.0)
MCHC: 30.5 g/dL (ref 30.0–36.0)
MCV: 83.2 fL (ref 80.0–100.0)
Monocytes Absolute: 0.7 10*3/uL (ref 0.1–1.0)
Monocytes Relative: 8 %
Neutro Abs: 6.2 10*3/uL (ref 1.7–7.7)
Neutrophils Relative %: 68 %
Platelets: 358 10*3/uL (ref 150–400)
RBC: 5.36 MIL/uL — ABNORMAL HIGH (ref 3.87–5.11)
RDW: 15.6 % — ABNORMAL HIGH (ref 11.5–15.5)
WBC: 8.8 10*3/uL (ref 4.0–10.5)
nRBC: 0 % (ref 0.0–0.2)

## 2020-09-14 LAB — TROPONIN I (HIGH SENSITIVITY): Troponin I (High Sensitivity): 3 ng/L (ref <18)

## 2020-09-14 LAB — I-STAT BETA HCG BLOOD, ED (MC, WL, AP ONLY): I-stat hCG, quantitative: <5 m[IU]/mL (ref <5)

## 2020-09-14 NOTE — ED Provider Notes (Signed)
Emergency Medicine Provider Triage Evaluation Note  Michaela Carrillo , a 35 y.o. female  was evaluated in triage.  Pt complains of chest pain and SOB.  Diagnosed covid + today, started having symptoms Monday.  She has had productive cough and chest tightness, getting worse throughout the day today.  Unsure of fevers.    Review of Systems  Positive: Chest pain, SOB Negative: Diarrhea, vomiting  Physical Exam  BP 130/71 (BP Location: Left Arm)   Pulse 89   Temp 97.8 F (36.6 C) (Oral)   Resp 18   SpO2 98%  Gen:   Awake, no distress, sounds congested Resp:  Normal effort, O2 98% on RA MSK:   Moves extremities without difficulty   Medical Decision Making  Medically screening exam initiated at 10:48 PM.  Appropriate orders placed.  Nancy Nordmann was informed that the remainder of the evaluation will be completed by another provider, this initial triage assessment does not replace that evaluation, and the importance of remaining in the ED until their evaluation is complete.    Larene Pickett, PA-C 09/14/20 2249    Lacretia Leigh, MD 09/15/20 2251

## 2020-09-14 NOTE — ED Triage Notes (Signed)
Pt tested +covid today, symptoms started on Monday, c/o cough, chest pain and shortness of breath.

## 2020-09-15 MED ORDER — PAXLOVID 10 X 150 MG & 10 X 100MG PO TBPK: 2.0000 | ORAL_TABLET | Freq: Two times a day (BID) | ORAL | 0 refills | Status: AC

## 2020-09-15 NOTE — ED Provider Notes (Signed)
[facility] EMERGENCY DEPARTMENT Provider Note   CSN: [id number] Arrival date & time: 09/14/20  2243     History Chief Complaint  Patient presents with   Chest Pain    Michaela Carrillo is a 35 y.o. female.  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   Abnormal Pap smear of cervix 05/06/2008   Morbid obesity ([place]) 04/09/2008   INTERNAL HEMORRHOIDS 84/13/2440   HELICOBACTER PYLORI INFECTION, HX OF 04/09/2008    Past Surgical History:  Procedure Laterality Date   CESAREAN SECTION  2005   COLPOSCOPY  2012     OB History     Gravida  2   Para  1   Term  0   Preterm  1   AB  1   Living  1      SAB  0   IAB  1   Ectopic  0   Multiple  0   Live Births              Family History  Problem Relation Age of Onset   Hypertension Mother    Diabetes Mother    Hypertension Father     Social History   Tobacco Use   Smoking status: Never   Smokeless tobacco: Never  Substance Use Topics   Alcohol use: No   Drug use: No    Home Medications Prior to Admission medications   Medication Sig Start Date End Date Taking? Authorizing Provider  famotidine (PEPCID) 20 MG tablet Take 1 tablet (20 mg total) by mouth 2 (two) times daily. 04/26/18   Jacqualine Mau, NP  ferrous sulfate 325 (65 FE) MG tablet Take 1 tablet (325 mg total) by mouth daily. 04/28/18   Isla Pence, MD  ibuprofen (ADVIL) 800 MG tablet Take 1 tablet (800 mg total) by mouth 3 (three) times daily. 04/21/20   Drenda Freeze, MD  lidocaine (LIDODERM) 5 % Place 1 patch onto the skin daily. Remove & Discard patch within 12 hours  or as directed by MD 04/21/20   Drenda Freeze, MD  methocarbamol (ROBAXIN) 500 MG tablet Take 1 tablet (500 mg total) by mouth 2 (two) times daily. 04/21/20   Drenda Freeze, MD  omeprazole (PRILOSEC) 40 MG capsule Take 40 mg by mouth daily.    [provider]    Allergies    Codeine  Review of Systems   Review of Systems  Constitutional:        Per HPI, otherwise negative  HENT:         Per HPI, otherwise negative  Respiratory:         Per HPI, otherwise negative  Cardiovascular:        Per HPI, otherwise negative  Gastrointestinal:  Negative for vomiting.  Endocrine:       Negative aside from HPI  Genitourinary:        Neg aside from HPI   Musculoskeletal:        Per HPI, otherwise negative  Skin: Negative.   Neurological:  Negative for syncope.  Psychiatric/Behavioral:  Positive for sleep disturbance.    Physical Exam Updated Vital Signs BP 138/88 (BP Location: Left Arm)   Pulse (!) 58   Temp (!) 97.5 F (36.4 C) (Oral)   Resp 15   SpO2 99%   Physical Exam Vitals and nursing note reviewed.  Constitutional:      General: She is not in acute distress.    Appearance: She is obese.  HENT:     Head: Normocephalic and atraumatic.  Eyes:     Conjunctiva/sclera: Conjunctivae normal.  Cardiovascular:     Rate and Rhythm: Normal rate and regular rhythm.  Pulmonary:     Effort: Pulmonary effort is normal. No respiratory distress.     Breath sounds: No stridor. No decreased breath sounds or wheezing.  Abdominal:     General: There is no distension.  Skin:    General: Skin is warm and dry.  Neurological:     Mental Status: She is alert and oriented to person, place, and time.     Cranial Nerves: No cranial nerve deficit.    ED Results / Procedures / Treatments   Labs (all labs ordered are listed, but only abnormal results are displayed) Labs Reviewed  CBC WITH DIFFERENTIAL/PLATELET - Abnormal; Notable for the following components:      Result  Value   RBC 5.36 (*)    MCH 25.4 (*)    RDW 15.6 (*)    All other components within normal limits  BASIC METABOLIC PANEL - Abnormal; Notable for the following components:   Sodium 134 (*)    All other components within normal limits  I-STAT BETA HCG BLOOD, ED ([facility], [facility], [facility] ONLY)  TROPONIN I (HIGH SENSITIVITY)    EKG EKG Interpretation  Date/Time:  Wednesday September 14 2020 22:57:18 EDT Ventricular Rate:  88 PR Interval:  142 QRS Duration: 72 QT Interval:  354 QTC Calculation: 428 R Axis:   45 Text Interpretation: Normal sinus rhythm Anterior injury pattern Otherwise within normal limits Confirmed by Carmin Muskrat [phone]) on 09/15/2020 7:37:23 AM  Radiology DG Chest Port 1 View  Result Date: 09/14/2020 CLINICAL DATA:  35 year old female with shortness of breath. Positive COVID-19. EXAM: PORTABLE CHEST 1 VIEW COMPARISON:  Chest radiograph dated 04/21/2020. FINDINGS: No focal consolidation, pleural effusion, or pneumothorax. Stable cardiac silhouette. No acute osseous pathology. IMPRESSION: No active disease. Electronically Signed   By: Anner Crete M.D.   On: 09/14/2020 23:18    Procedures Procedures   Medications Ordered in ED Medications - No data to display  ED Course  I have reviewed the triage vital signs and the nursing notes.  Pertinent labs & imaging results that were available during my care of the patient were reviewed by me and considered in my medical decision making (see chart for details).  Michaela Carrillo was evaluated in Emergency Department on 09/15/2020 for the symptoms described in the history of present illness. She was evaluated in the context of the global COVID-19 pandemic, which necessitated consideration that the patient might be at risk for infection with the SARS-CoV-2 virus that causes COVID-19. Institutional protocols and algorithms that pertain to the evaluation of patients at risk for COVID-19 are in a state of rapid change based on  information released by regulatory bodies including the CDC and federal and state organizations. These policies and algorithms were followed during the patient's care in the ED.   MDM Rules/Calculators/A&P Obese adult female presents with 4 days of cough, congestion, insomnia in context of positive COVID  test performed 2 days ago.  Here patient is awake, alert, has no new oxygen requirement, no evidence for distress, no increased work of breathing.  Low suspicion for pulmonary embolism given his physical exam findings, vital signs. Patient's EKG is nonischemic, chest x-ray does not demonstrate concurrent pneumonia, labs are reassuring.  Patient is obese, and given her history of asthma is a candidate for emergency use antibody therapy.  Patient will be prescribed this, discharged in stable condition. Final Clinical Impression(s) / ED Diagnoses Final diagnoses:  COVID    Rx / DC Orders ED Discharge Orders          Ordered    nirmatrelvir/ritonavir EUA, renal dosing, (PAXLOVID) TBPK  2 times daily        09/15/20 0807             Carmin Muskrat, MD 09/15/20 [phone]

## 2020-09-15 NOTE — Discharge Instructions (Addendum)
As discussed, your COVID infection will likely take several days, possibly longer to resolve.  You are being prescribed a medication which should speed this process.  Take medication as directed and do not hesitate to return here for concerning changes in your condition.

## 2021-01-25 ENCOUNTER — Other Ambulatory Visit (HOSPITAL_COMMUNITY): Payer: Self-pay

## 2021-01-25 MED ORDER — INFLUENZA VAC SPLIT QUAD 0.5 ML IM SUSY
0.5000 mL | PREFILLED_SYRINGE | INTRAMUSCULAR | 0 refills | Status: DC
  Filled 2021-01-25: qty 0.5, 1d supply, fill #0

## 2022-04-27 DIAGNOSIS — K5641 Fecal impaction: Secondary | ICD-10-CM | POA: Diagnosis not present

## 2022-04-27 DIAGNOSIS — K219 Gastro-esophageal reflux disease without esophagitis: Secondary | ICD-10-CM | POA: Diagnosis not present

## 2022-04-27 DIAGNOSIS — Z01818 Encounter for other preprocedural examination: Secondary | ICD-10-CM | POA: Diagnosis not present

## 2022-12-26 IMAGING — DX DG PELVIS 1-2V
1 series · 1 of 1 positions shown · non-contrast
Comparison: None.

CLINICAL DATA: Status post fall.

EXAM:
PELVIS - 1-2 VIEW

[pelvis ap]
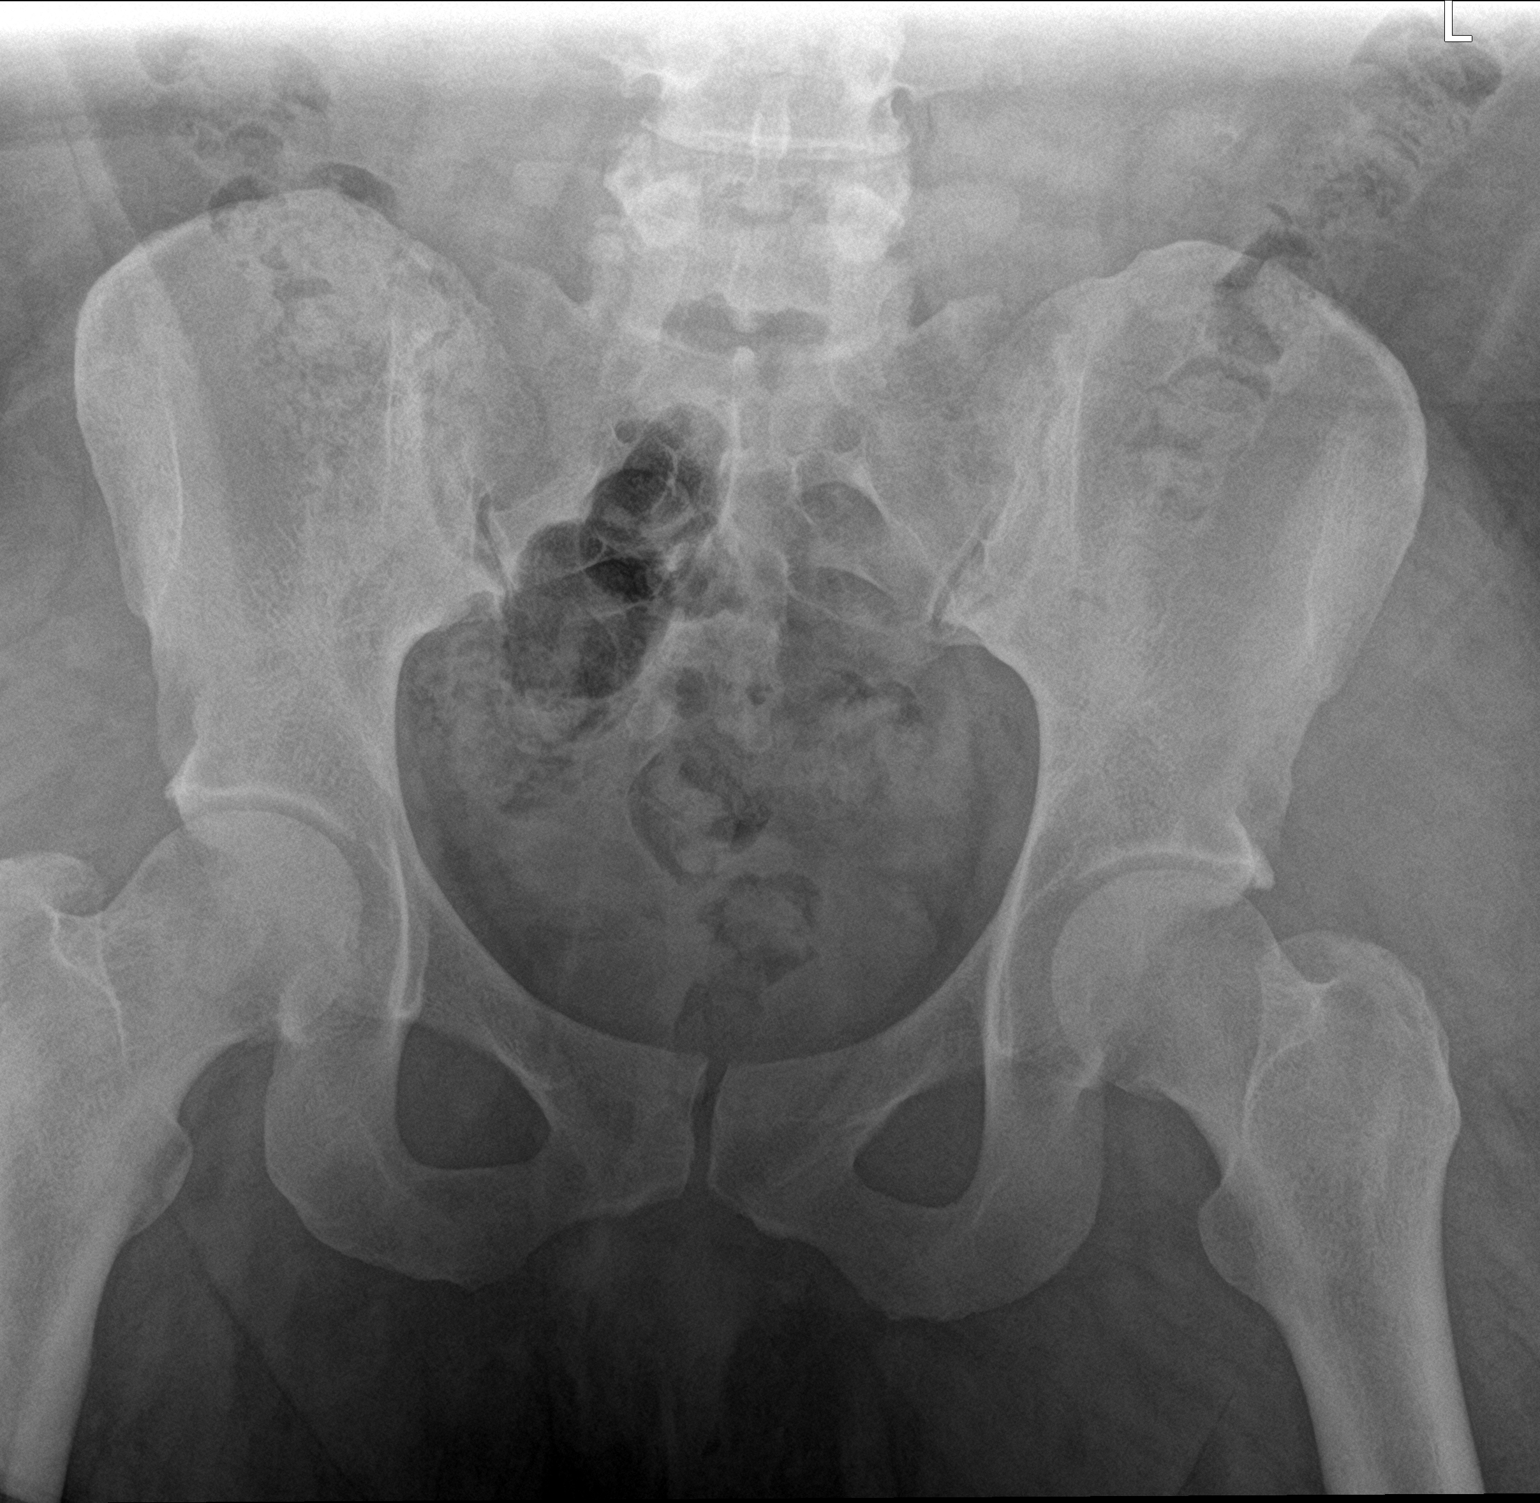

[1 of 1 positions shown; findings below may reference images not displayed]

FINDINGS: There is no evidence of pelvic fracture or diastasis. No pelvic bone
lesions are seen.
IMPRESSION: Negative.

## 2022-12-26 IMAGING — DX DG FOOT COMPLETE 3+V*R*
3 series · 3 of 3 positions shown · non-contrast
Comparison: Radiograph 03/13/2015

CLINICAL DATA: Slipped on ice

EXAM:
RIGHT FOOT COMPLETE - 3+ VIEW

[foot ap]
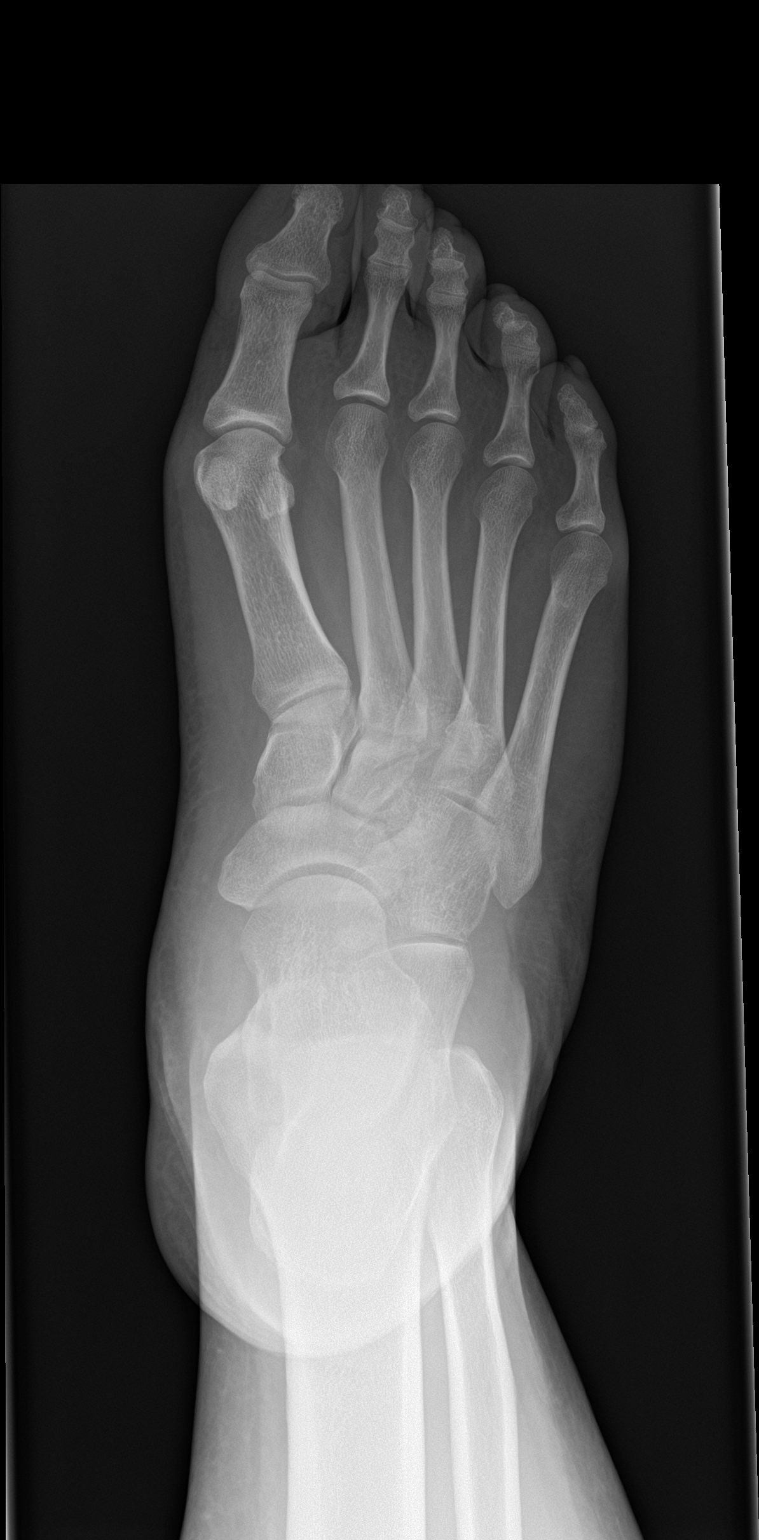

[foot obl]
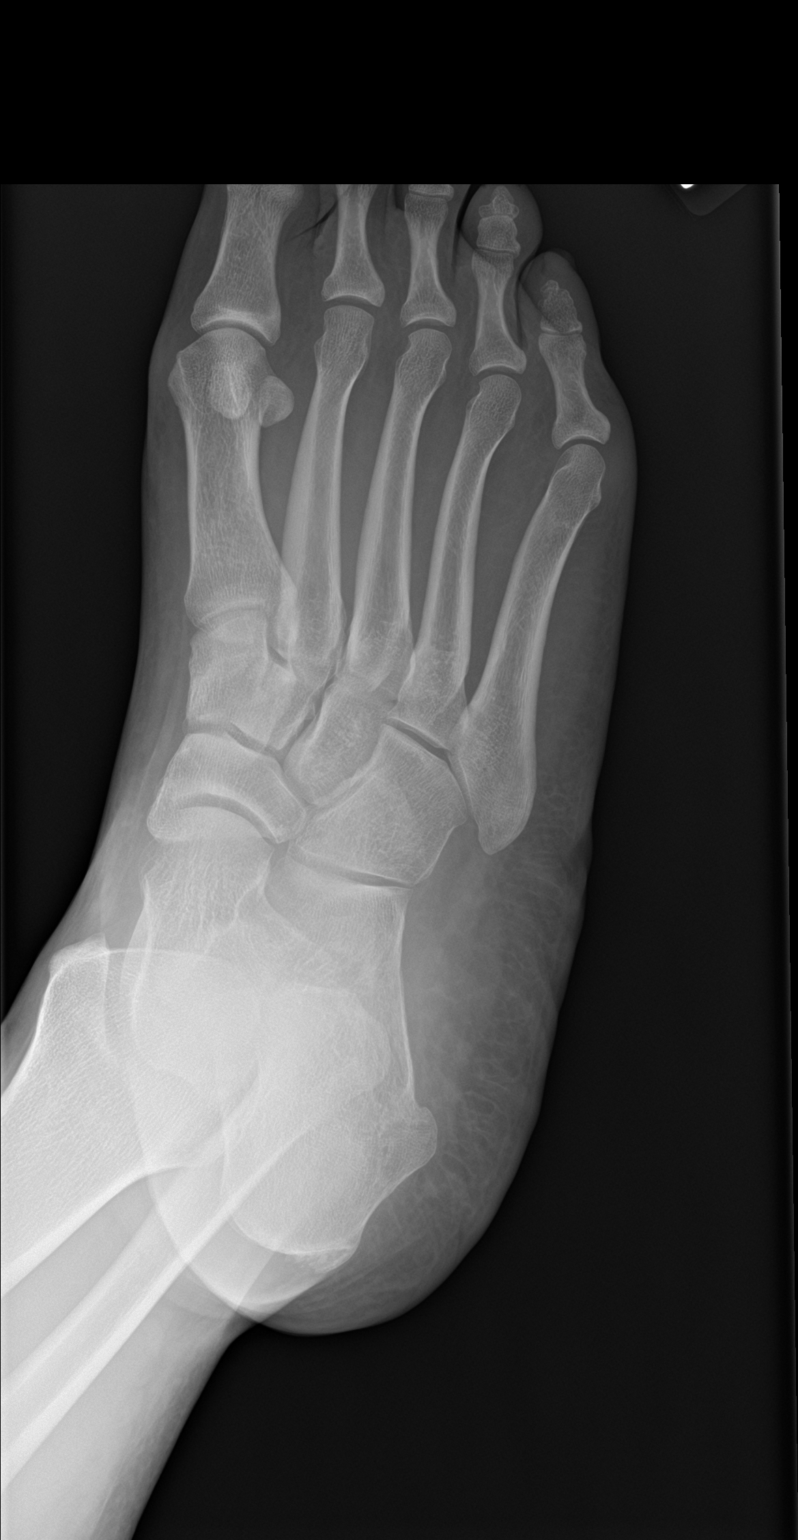

[foot lat]
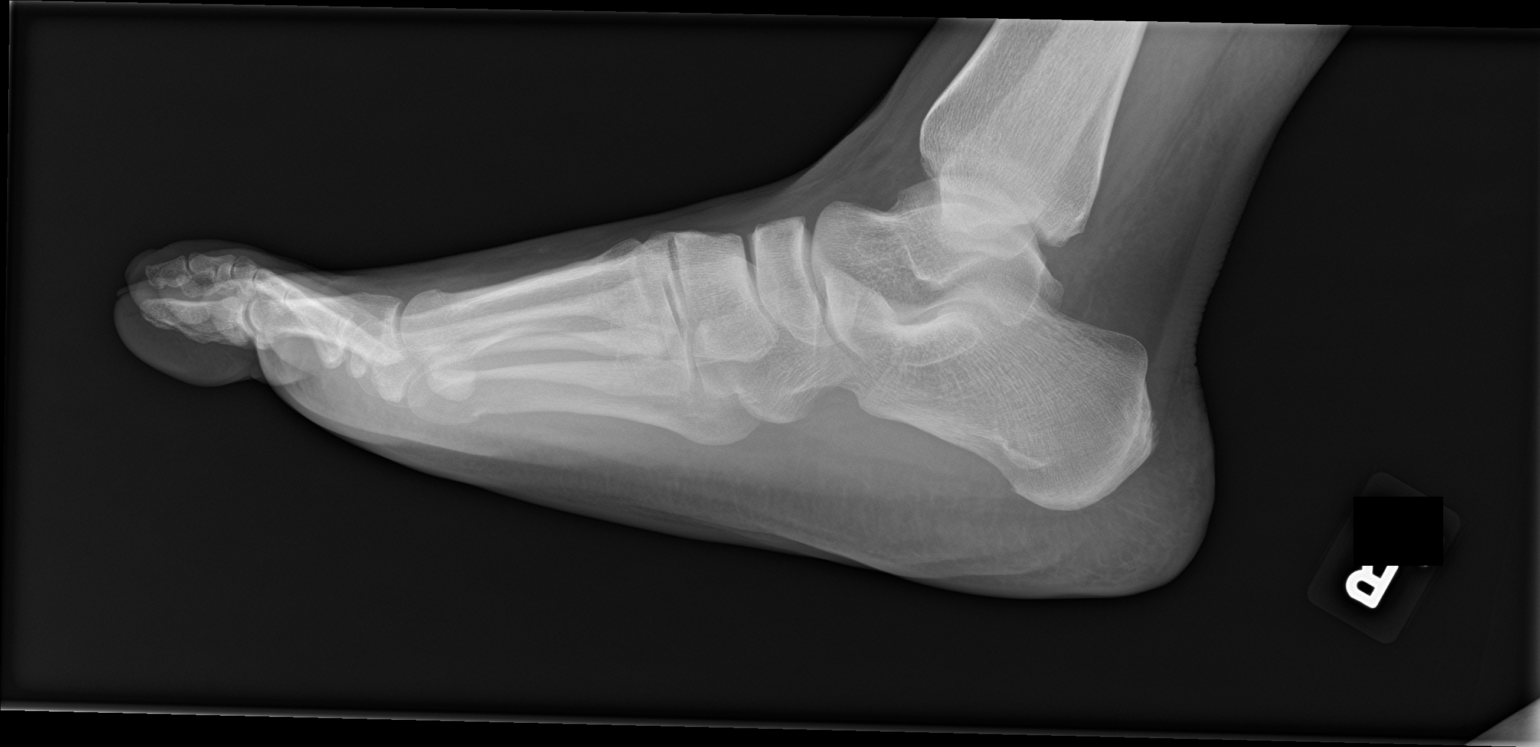

[3 of 3 positions shown; findings below may reference images not displayed]

FINDINGS: No acute fracture or traumatic malalignment is seen. Mild hallux
valgus deformity and slight pes planus may be present though
incompletely assessed on nonweightbearing views. No acute or
conspicuous osseous lesions. Congenital fusion of the fifth middle
and distal phalanx. There is some mild lateral soft tissue swelling
but without subjacent osseous abnormality. Normal bone
mineralization.
IMPRESSION: 1. No acute fracture or traumatic malalignment.
2. Mild lateral soft tissue swelling without subjacent osseous
abnormality.
3. Mild hallux valgus deformity and slight pes planus may be present
though incompletely assessed on nonweightbearing views.

## 2023-05-05 ENCOUNTER — Emergency Department (HOSPITAL_BASED_OUTPATIENT_CLINIC_OR_DEPARTMENT_OTHER)
Admission: EM | Admit: 2023-05-05 | Discharge: 2023-05-06 | Disposition: A | Discharge status: Home / Self Care | Payer: No Typology Code available for payment source | Attending: Emergency Medicine | Admitting: Emergency Medicine

## 2023-05-05 ENCOUNTER — Encounter (HOSPITAL_BASED_OUTPATIENT_CLINIC_OR_DEPARTMENT_OTHER): Payer: Self-pay | Admitting: Emergency Medicine

## 2023-05-05 ENCOUNTER — Other Ambulatory Visit: Payer: Self-pay

## 2023-05-05 ENCOUNTER — Emergency Department (HOSPITAL_BASED_OUTPATIENT_CLINIC_OR_DEPARTMENT_OTHER): Payer: No Typology Code available for payment source | Admitting: Radiology

## 2023-05-05 DIAGNOSIS — J069 Acute upper respiratory infection, unspecified: Secondary | ICD-10-CM | POA: Insufficient documentation

## 2023-05-05 DIAGNOSIS — Z6836 Body mass index (BMI) 36.0-36.9, adult: Secondary | ICD-10-CM | POA: Insufficient documentation

## 2023-05-05 DIAGNOSIS — R059 Cough, unspecified: Secondary | ICD-10-CM | POA: Diagnosis present

## 2023-05-05 DIAGNOSIS — E669 Obesity, unspecified: Secondary | ICD-10-CM | POA: Insufficient documentation

## 2023-05-05 LAB — RESP PANEL BY RT-PCR (RSV, FLU A&B, COVID)  RVPGX2
Influenza A by PCR: NEGATIVE
Influenza B by PCR: NEGATIVE
Resp Syncytial Virus by PCR: NEGATIVE
SARS Coronavirus 2 by RT PCR: NEGATIVE

## 2023-05-05 LAB — PREGNANCY, URINE: Preg Test, Ur: NEGATIVE

## 2023-05-05 NOTE — ED Triage Notes (Signed)
Pt coming from home for a cough that's been going on for 1 week. Denies fever/chills

## 2023-05-06 MED ORDER — OXYMETAZOLINE HCL 0.05 % NA SOLN: 1.0000 | Freq: Two times a day (BID) | NASAL | 0 refills | Status: AC

## 2023-05-06 MED ORDER — GUAIFENESIN-DM 100-10 MG/5ML PO SYRP: 5.0000 mL | ORAL_SOLUTION | ORAL | 0 refills | Status: AC | PRN

## 2023-05-06 NOTE — ED Notes (Signed)
 RN reviewed discharge instructions with pt. Pt verbalized understanding and had no further questions. VSS upon discharge.

## 2023-05-06 NOTE — Discharge Instructions (Addendum)
It was a pleasure caring for you today in the emergency department.  Consider using humidifier in your bedroom.  Use sinus rinse twice daily for the next 7 days.  Drink plenty of liquids.  Consider drinking warm beverages with honey 3 times daily to help with cough.  Please wear a mask in public if you are coughing.  Please return to the emergency department for any worsening or worrisome symptoms.

## 2023-05-06 NOTE — ED Provider Notes (Signed)
[place] EMERGENCY DEPARTMENT AT [facility] Provider Note  CSN: [id number] Arrival date & time: 05/05/23 1930  Chief Complaint(s) Cough  HPI Michaela Carrillo is a 38 y.o. female with past medical history as below, significant for preeclampsia, obesity, HPV who presents to the ED with complaint of cough  She reports cough for the past week, occasional productive with sputum of green color.  No fevers or chills, no bodyaches, no chest pain or dyspnea.  No nausea or vomiting.  She tried OTC antitussive without much relief of symptoms.  No sick contacts.  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 Authorizing Provider  famotidine (PEPCID) 20 MG tablet Take 1 tablet (20 mg total) by mouth 2 (two) times daily. 04/26/18   Alene Mires, NP  ferrous sulfate 325 (65 FE) MG tablet Take 1 tablet (325 mg total) by mouth daily. 04/28/18   Jacalyn Lefevre, MD  ibuprofen (ADVIL) 800 MG tablet Take 1 tablet (800 mg total) by mouth 3 (three) times daily. 04/21/20   Charlynne Pander, MD  influenza vac split quadrivalent PF (FLUARIX) 0.5 ML injection Inject 0.5 mLs into the muscle. 01/25/21   Judyann Munson, MD  lidocaine (LIDODERM) 5 % Place 1 patch onto the skin daily. Remove & Discard patch within 12 hours or as  directed by MD 04/21/20   Charlynne Pander, MD  methocarbamol (ROBAXIN) 500 MG tablet Take 1 tablet (500 mg total) by mouth 2 (two) times daily. 04/21/20   Charlynne Pander, MD  omeprazole (PRILOSEC) 40 MG capsule Take 40 mg by mouth daily.    [provider]                                                                                                                                    Past Surgical History Past Surgical History:  Procedure Laterality Date   CESAREAN SECTION  2005   COLPOSCOPY  2012   Family History Family History  Problem Relation Age of Onset   Hypertension Mother    Diabetes Mother    Hypertension Father     Social History Social History   Tobacco Use   Smoking status: Never   Smokeless  tobacco: Never  Substance Use Topics   Alcohol use: No   Drug use: No   Allergies Codeine  Review of Systems Review of Systems  Constitutional:  Negative for chills and fever.  HENT:  Positive for congestion and postnasal drip.   Respiratory:  Positive for cough. Negative for chest tightness and shortness of breath.   Cardiovascular:  Negative for chest pain.  Gastrointestinal:  Negative for abdominal pain and nausea.  Genitourinary:  Negative for difficulty urinating.  Musculoskeletal:  Negative for arthralgias.  Neurological:  Negative for light-headedness.  All other systems reviewed and are negative.   Physical Exam Vital Signs  I have reviewed the triage vital signs BP 136/81 (BP Location: Right Arm)   Pulse 62   Temp 98.1 F (36.7 C)   Resp 18   Ht 5\' 4"  (1.626 m)   Wt 97.5 kg   LMP 01/10/2023   SpO2 95%   BMI 36.90 kg/m  Physical Exam Vitals and nursing note reviewed.  Constitutional:      General: She is not in acute distress.    Appearance: Normal appearance. She is obese.  HENT:     Head: Normocephalic and atraumatic.     Right Ear: External ear normal.     Left Ear: External ear normal.     Nose: Nose normal.      Mouth/Throat:     Mouth: Mucous membranes are moist.  Eyes:     General: No scleral icterus.       Right eye: No discharge.        Left eye: No discharge.  Cardiovascular:     Rate and Rhythm: Normal rate and regular rhythm.     Pulses: Normal pulses.     Heart sounds: Normal heart sounds.  Pulmonary:     Effort: Pulmonary effort is normal. No respiratory distress.     Breath sounds: Normal breath sounds. No stridor.  Abdominal:     General: Abdomen is flat. There is no distension.     Palpations: Abdomen is soft.     Tenderness: There is no abdominal tenderness.  Musculoskeletal:     Cervical back: No rigidity.     Right lower leg: No edema.     Left lower leg: No edema.  Skin:    General: Skin is warm and dry.     Capillary Refill: Capillary refill takes less than 2 seconds.  Neurological:     Mental Status: She is alert.  Psychiatric:        Mood and Affect: Mood normal.        Behavior: Behavior normal. Behavior is cooperative.     ED Results and Treatments Labs (all labs ordered are listed, but only abnormal results are displayed) Labs Reviewed  RESP PANEL BY RT-PCR (RSV, FLU A&B, COVID)  RVPGX2  PREGNANCY, URINE  Radiology DG Chest 1 View Result Date: 05/05/2023 CLINICAL DATA:  Persistent cough EXAM: CHEST  1 VIEW COMPARISON:  09/14/2020 FINDINGS: The heart size and mediastinal contours are within normal limits. Both lungs are clear. The visualized skeletal structures are unremarkable. IMPRESSION: No active disease. Electronically Signed   By: Jasmine Pang M.D.   On: 05/05/2023 20:44    Pertinent labs & imaging results that were available during my care of the patient were reviewed by me and considered in my medical decision making (see MDM for details).  Medications Ordered in ED Medications - No data to display                                                                                                                                    Procedures Procedures  (including critical care time)  Medical Decision Making / ED Course    Medical Decision Making:    Michaela Carrillo is a 38 y.o. female with past medical history as below, significant for preeclampsia, obesity, HPV who presents to the ED with complaint of cough. The complaint involves an extensive differential diagnosis and also carries with it a high risk of complications and morbidity.  Serious etiology was considered. Ddx includes but is not limited to: Pneumonia, URI, bronchitis, viral syndrome, etc.  Complete initial physical exam performed, notably the patient was in no distress no hypoxia.    Reviewed and confirmed nursing documentation for past medical history, family history, social history.  Vital signs reviewed.         Brief summary: 38 year old female history as above here with cough x 1 week.  RVP ordered in triage is negative, pranks test is negative.  Chest x-ray without evidence of pneumonia.  Lungs clear to auscultation bilateral on my assessment.  She has no hypoxia.  There are likely viral cough.  Start antitussive, Afrin, humidifier, follow-up PCP  The patient improved significantly and was discharged in stable condition. Detailed discussions were had with the patient/guardian regarding current findings, and need for close f/u with PCP or on call doctor. The patient/guardian has been instructed to return immediately if the symptoms worsen in any way for re-evaluation. Patient/guardian verbalized understanding and is in agreement with current care plan. All questions answered prior to discharge.                  Additional history obtained: -Additional history obtained from family -External records from outside source obtained and reviewed including: Chart review including previous notes, labs, imaging, consultation notes  including  Primary care documentation, prior ED visit, prior labs   Lab Tests: -I ordered, reviewed, and interpreted labs.   The pertinent results include:   Labs Reviewed  RESP PANEL BY RT-PCR (RSV, FLU A&B, COVID)  RVPGX2  PREGNANCY, URINE    Notable for negative  EKG   EKG Interpretation Date/Time:    Ventricular Rate:    PR Interval:  QRS Duration:    QT Interval:    QTC Calculation:   R Axis:      Text Interpretation:           Imaging Studies ordered: I ordered imaging studies including x-ray I independently visualized the following imaging with scope of interpretation limited to determining acute life threatening conditions related to emergency care; findings noted above I independently visualized and interpreted imaging. I agree with the radiologist interpretation   Medicines ordered and prescription drug management: No orders of the defined types were placed in this encounter.   -I have reviewed the patients home medicines and have made adjustments as needed   Consultations Obtained: Not applicable  Cardiac Monitoring: Continuous pulse oximetry interpreted by myself, 97% on RA.    Social Determinants of Health:  Diagnosis or treatment significantly limited by social determinants of health: obesity, no pcp   Reevaluation: After the interventions noted above, I reevaluated the patient and found that they have stayed the same  Co morbidities that complicate the patient evaluation  Past Medical History:  Diagnosis Date   Abnormal Pap smear 2012   Preeclampsia       Dispostion: Disposition decision including need for hospitalization was considered, and patient discharged from emergency department.    Final Clinical Impression(s) / ED Diagnoses Final diagnoses:  Viral URI with cough        Sloan Leiter, DO 05/06/23 0025

## 2024-03-11 ENCOUNTER — Ambulatory Visit
Admission: EM | Admit: 2024-03-11 | Discharge: 2024-03-11 | Disposition: A | Discharge status: Home / Self Care | Attending: Family Medicine | Admitting: Family Medicine

## 2024-03-11 DIAGNOSIS — H00011 Hordeolum externum right upper eyelid: Secondary | ICD-10-CM

## 2024-03-11 DIAGNOSIS — H0289 Other specified disorders of eyelid: Secondary | ICD-10-CM

## 2024-03-11 MED ORDER — PREDNISONE 20 MG PO TABS: 40.0000 mg | ORAL_TABLET | Freq: Every day | ORAL | 0 refills | Status: AC

## 2024-03-11 MED ORDER — ERYTHROMYCIN 5 MG/GM OP OINT: TOPICAL_OINTMENT | OPHTHALMIC | 0 refills | Status: AC

## 2024-03-11 NOTE — ED Triage Notes (Signed)
 Patient reports eye starting hurting the 28th-29th of November. Concerned it was from fake eyelashes and the glue applied but now seeing swelling on upper lids on both eyes. Using compress's to help (some improvement). Now seeing some discharge in both eyes. No hx of corrective lenses.

## 2024-03-14 NOTE — ED Provider Notes (Signed)
 [facility]   [id number] 03/11/24 Arrival Time: 0803  ASSESSMENT & PLAN:  1. Hordeolum externum of right upper eyelid   2. Irritation of eyelid    Begin: Meds ordered this encounter  Medications   erythromycin  ophthalmic ointment    Sig: Place a 1/2 inch ribbon of ointment into both lower eyelids for the next 5 days.    Dispense:  3.5 g    Refill:  0   predniSONE  (DELTASONE ) 20 MG tablet    Sig: Take 2 tablets (40 mg total) by mouth daily.    Dispense:  10 tablet    Refill:  0    Discussed the diagnosis and proper care of conjunctivitis.  Stressed household presenter, broadcasting. Ophthalmic ointment per orders. Warm compress to eye(s). Local eye care discussed.  Reviewed expectations re: course of current medical issues. Questions answered. Outlined signs and symptoms indicating need for more acute intervention. Patient verbalized understanding. After Visit Summary given.   SUBJECTIVE:  Michaela Carrillo is a 38 y.o. female who presents with complaint of bilateral eye and eyelid irritation s/p wearing fake eyelashes a few weeks ago; eyes now with watery drainage; and a bump on my right eyeleid (upper). Denies visual changes. Does not wear contact lenses. No tx PTA.  OBJECTIVE:  Vitals:   03/11/24 0819 03/11/24 0821 03/11/24 0823  BP:  110/68   Pulse:  (!) 51 (!) 49  Resp:  18   Temp:  97.8 F (36.6 C)   TempSrc:  Oral   SpO2:  96%   Weight: 90.7 kg    Height: 5' 4 (1.626 m)      General appearance: alert; no distress HEENT: [place]; AT; PERRLA; no restriction of the extraocular movements OU: 1+ conjunctival erythema; watery drainage; without limbal flush Upper R eyelid with hordeolum present Lungs: clear to auscultation bilaterally; unlabored respirations Heart: regular rate and rhythm Skin: warm and dry Psychological: alert and cooperative; normal mood and affect   Allergies[1]  Past Medical History:  Diagnosis Date   Abnormal Pap smear 2012    Preeclampsia    Social History   Socioeconomic History   Marital status: Single    Spouse name: Not on file   Number of children: Not on file   Years of education: Not on file   Highest education level: Not on file  Occupational History   Not on file  Tobacco Use   Smoking status: Never   Smokeless tobacco: Never  Vaping Use   Vaping status: Never Used  Substance and Sexual Activity   Alcohol use: No   Drug use: No   Sexual activity: Not Currently    Birth control/protection: None  Other Topics Concern   Not on file  Social History Narrative   Not on file   Social Drivers of Health   Tobacco Use: Low Risk (03/11/2024)   Patient History    Smoking Tobacco Use: Never    Smokeless Tobacco Use: Never    Passive Exposure: Not on file  Financial Resource Strain: Not on file  Food Insecurity: Low Risk (09/25/2022)   Received from [facility]   Epic    Within the past 12 months, you worried that your food would run out before you got money to buy more: Never true    Within the past 12 months, the food you bought just didn't last and you didn't have money to get more. : Never true  Transportation Needs: Not on file (09/25/2022)  Physical Activity: Not  on file  Stress: Not on file  Social Connections: Not on file  Intimate Partner Violence: Not on file  Depression (EYV7-0): Not on file  Alcohol Screen: Not on file  Housing: Low Risk (09/25/2022)   Received from [facility]   Epic    What is your living situation today?: I have a steady place to live    Think about the place you live. Do you have problems with any of the following? Choose all that apply:: None/None on this list  Utilities: Low Risk (09/25/2022)   Received from [facility]   Utilities    In the past 12 months has the electric, gas, oil, or water company threatened to shut off services in your home? : No  Health Literacy: Not on file   Family History  Problem Relation Age of Onset   Hypertension  Mother    Diabetes Mother    Hypertension Father    Past Surgical History:  Procedure Laterality Date   CESAREAN SECTION  2005   COLPOSCOPY  2012       [1]  Allergies Allergen Reactions   Codeine Itching     Rolinda Rogue, MD 03/14/24 [phone]
